# Patient Record
Sex: Female | Born: 1955 | Race: Black or African American | Hispanic: No | Marital: Single | Smoking: Former smoker
Health system: Southern US, Community
[De-identification: ages and names within clinical notes are randomized; demographics above are authoritative.]

## PROBLEM LIST (undated history)

## (undated) DIAGNOSIS — E785 Hyperlipidemia, unspecified: Secondary | ICD-10-CM

## (undated) DIAGNOSIS — E041 Nontoxic single thyroid nodule: Secondary | ICD-10-CM

## (undated) DIAGNOSIS — D649 Anemia, unspecified: Secondary | ICD-10-CM

## (undated) DIAGNOSIS — J309 Allergic rhinitis, unspecified: Secondary | ICD-10-CM

## (undated) DIAGNOSIS — N840 Polyp of corpus uteri: Secondary | ICD-10-CM

## (undated) HISTORY — DX: Anemia, unspecified: D64.9

## (undated) HISTORY — PX: FOOT SURGERY: SHX648

## (undated) HISTORY — DX: Nontoxic single thyroid nodule: E04.1

## (undated) HISTORY — DX: Hyperlipidemia, unspecified: E78.5

## (undated) HISTORY — PX: DILATION AND CURETTAGE OF UTERUS: SHX78

## (undated) HISTORY — DX: Polyp of corpus uteri: N84.0

## (undated) HISTORY — DX: Allergic rhinitis, unspecified: J30.9

---

## 2001-06-23 HISTORY — PX: HYSTEROSCOPY WITH D & C: SHX1775

## 2004-08-01 ENCOUNTER — Ambulatory Visit: Payer: Self-pay | Admitting: Obstetrics and Gynecology

## 2005-03-05 ENCOUNTER — Ambulatory Visit: Payer: Self-pay | Admitting: Internal Medicine

## 2005-09-16 ENCOUNTER — Ambulatory Visit: Payer: Self-pay | Admitting: Obstetrics and Gynecology

## 2006-05-11 ENCOUNTER — Emergency Department: Payer: Self-pay | Admitting: Emergency Medicine

## 2006-05-11 ENCOUNTER — Other Ambulatory Visit: Payer: Self-pay

## 2006-10-20 ENCOUNTER — Ambulatory Visit: Payer: Self-pay | Admitting: Obstetrics and Gynecology

## 2007-06-24 HISTORY — PX: PAROTID GLAND TUMOR EXCISION: SHX5221

## 2007-07-15 ENCOUNTER — Ambulatory Visit: Payer: Self-pay | Admitting: Internal Medicine

## 2007-08-19 ENCOUNTER — Ambulatory Visit: Payer: Self-pay | Admitting: Otolaryngology

## 2007-10-21 ENCOUNTER — Ambulatory Visit: Payer: Self-pay | Admitting: Internal Medicine

## 2008-03-10 ENCOUNTER — Ambulatory Visit: Payer: Self-pay | Admitting: Gastroenterology

## 2008-12-06 ENCOUNTER — Ambulatory Visit: Payer: Self-pay | Admitting: Internal Medicine

## 2009-12-11 ENCOUNTER — Ambulatory Visit: Payer: Self-pay | Admitting: Internal Medicine

## 2011-01-07 ENCOUNTER — Ambulatory Visit: Payer: Self-pay | Admitting: Internal Medicine

## 2011-11-30 ENCOUNTER — Emergency Department: Payer: Self-pay | Admitting: Emergency Medicine

## 2011-11-30 LAB — COMPREHENSIVE METABOLIC PANEL
Albumin: 3.4 g/dL (ref 3.4–5.0)
Alkaline Phosphatase: 98 U/L (ref 50–136)
Anion Gap: 9 (ref 7–16)
Bilirubin,Total: 0.7 mg/dL (ref 0.2–1.0)
Calcium, Total: 9.4 mg/dL (ref 8.5–10.1)
EGFR (African American): >60
EGFR (Non-African Amer.): >60
Osmolality: 278 (ref 275–301)
Potassium: 3.8 mmol/L (ref 3.5–5.1)
Sodium: 140 mmol/L (ref 136–145)

## 2011-11-30 LAB — TROPONIN I: Troponin-I: <0.02 ng/mL

## 2011-11-30 LAB — CBC
HCT: 36.5 % (ref 35.0–47.0)
HGB: 11.8 g/dL — ABNORMAL LOW (ref 12.0–16.0)
MCH: 28.7 pg (ref 26.0–34.0)
MCV: 89 fL (ref 80–100)
Platelet: 297 10*3/uL (ref 150–440)
RBC: 4.11 10*6/uL (ref 3.80–5.20)
WBC: 9.3 10*3/uL (ref 3.6–11.0)

## 2012-03-04 ENCOUNTER — Ambulatory Visit: Payer: Self-pay | Admitting: Obstetrics and Gynecology

## 2013-03-14 LAB — HM PAP SMEAR: HM PAP: NEGATIVE

## 2013-04-19 LAB — CBC
HGB: 11.1 g/dL — ABNORMAL LOW (ref 12.0–16.0)
MCH: 28.9 pg (ref 26.0–34.0)
MCV: 87 fL (ref 80–100)
RBC: 3.83 10*6/uL (ref 3.80–5.20)
RDW: 13.1 % (ref 11.5–14.5)
WBC: 11.5 10*3/uL — ABNORMAL HIGH (ref 3.6–11.0)

## 2013-04-19 LAB — TROPONIN I: Troponin-I: <0.02 ng/mL

## 2013-04-19 LAB — BASIC METABOLIC PANEL
Anion Gap: 5 — ABNORMAL LOW (ref 7–16)
Chloride: 103 mmol/L (ref 98–107)
Co2: 29 mmol/L (ref 21–32)
Creatinine: 1 mg/dL (ref 0.60–1.30)
EGFR (Non-African Amer.): >60
Glucose: 156 mg/dL — ABNORMAL HIGH (ref 65–99)
Osmolality: 276 (ref 275–301)

## 2013-04-20 ENCOUNTER — Inpatient Hospital Stay: Payer: Self-pay | Admitting: Internal Medicine

## 2013-04-20 LAB — TROPONIN I
Troponin-I: 0.12 ng/mL — ABNORMAL HIGH
Troponin-I: 1.8 ng/mL — ABNORMAL HIGH
Troponin-I: 4.6 ng/mL — ABNORMAL HIGH

## 2013-04-20 LAB — CK TOTAL AND CKMB (NOT AT ARMC)
CK, Total: 289 U/L — ABNORMAL HIGH (ref 21–215)
CK-MB: 4 ng/mL — ABNORMAL HIGH (ref 0.5–3.6)
CK-MB: 31.7 ng/mL — ABNORMAL HIGH (ref 0.5–3.6)

## 2013-04-21 LAB — LIPID PANEL
Cholesterol: 200 mg/dL (ref 0–200)
VLDL Cholesterol, Calc: 28 mg/dL (ref 5–40)

## 2013-04-21 LAB — BASIC METABOLIC PANEL
BUN: 8 mg/dL (ref 7–18)
Calcium, Total: 8.8 mg/dL (ref 8.5–10.1)
Chloride: 108 mmol/L — ABNORMAL HIGH (ref 98–107)
Creatinine: 0.76 mg/dL (ref 0.60–1.30)
EGFR (African American): >60
Osmolality: 272 (ref 275–301)
Potassium: 3.9 mmol/L (ref 3.5–5.1)

## 2013-05-16 ENCOUNTER — Ambulatory Visit: Payer: Self-pay | Admitting: Obstetrics and Gynecology

## 2014-05-09 ENCOUNTER — Ambulatory Visit: Payer: Self-pay | Admitting: Obstetrics and Gynecology

## 2014-05-23 ENCOUNTER — Ambulatory Visit: Payer: Self-pay | Admitting: Obstetrics and Gynecology

## 2014-06-06 ENCOUNTER — Ambulatory Visit: Payer: Self-pay | Admitting: Obstetrics and Gynecology

## 2014-06-06 LAB — HM MAMMOGRAPHY

## 2014-06-23 ENCOUNTER — Ambulatory Visit: Payer: Self-pay | Admitting: Obstetrics and Gynecology

## 2014-08-11 DIAGNOSIS — I10 Essential (primary) hypertension: Secondary | ICD-10-CM | POA: Insufficient documentation

## 2014-10-13 NOTE — Discharge Summary (Signed)
PATIENT NAME:  Catherine Pace, Catherine Pace MR#:  [id number] DATE OF BIRTH:  [birth year]  DATE OF ADMISSION:  04/20/2013 DATE OF DISCHARGE:  04/21/2013  PRESENTING COMPLAINT: Chest pain.   DISCHARGE DIAGNOSES: 1.  Acute non-Q-wave myocardial infarction, appears stress induced.  2.  Hyperlipidemia.  3.  Morbid obesity. 4.  Hypertension.   LABORATORY AND PROCEDURES: Cardiac cath done by Dr. Saralyn Pilar shows normal coronaries, EF as 74%.  Lipid profile within normal limits, except LDL of 132. Basic metabolic panel within normal limits. Cardiac enzymes went up from 0.02, 0.12, 1.80, and 4.60.   CONSULTANTS: Cardiology with Dr. Saralyn Pilar.  DISCHARGE MEDICATIONS: 1.  Hydrochlorothiazide/triamterene 25/37.5 mg 1 p.o. daily.  2.  Aspirin 81 mg daily.  3.  Metoprolol 25 mg extended release p.o. daily.  4.  Simvastatin 20 mg at bedtime.   DIET: Low fat, low cholesterol.  DISCHARGE FOLLOWUP:  1.  Establish primary care physician in the area.  2.  Follow up with Dr. Saralyn Pilar in 2 to 4 weeks as needed.   BRIEF SUMMARY OF HOSPITAL COURSE: Ms. Cost is a pleasant 59 year old African American female who came to the Emergency Room with chest pain. She received 324 mg of aspirin in the ED. She was found to have positive enzymes and was admitted with:  1.  Acute non-Q-wave MI. The patient was continued on sub-Q Lovenox, beta blockers, aspirin and statins. She was seen by Dr. Saralyn Pilar and with her positive enzymes underwent cardiac catheterization which was essentially negative with normal coronaries. It was suspected stress-induced with the patient undergoing a lot of stress at home. She did not have any further chest pain, she did very well after cardiac cath and will follow up with Dr. Saralyn Pilar as outpatient.  2.  Hypertension. Blood pressure meds, hydrochlorothiazide/triamterene, were resumed. She was also placed on low-dose beta blockers.  3.  Hyperlipidemia. On statins.  4.  Hypokalemia, repleted.  5.  GI  prophylaxis. The patient was kept on Protonix.   Hospital stay otherwise remained stable. The patient remained a FULL CODE.   TIME SPENT: 40 minutes.  ____________________________ Hart Rochester Posey Pronto, MD sap:sb D: 04/22/2013 07:30:38 ET T: 04/22/2013 07:47:43 ET JOB#: [id number]  cc: Brayley Mackowiak A. Posey Pronto, MD, <Dictator> Ilda Basset MD ELECTRONICALLY SIGNED 04/28/2013 13:27

## 2014-10-13 NOTE — H&P (Signed)
PATIENT NAME:  Catherine Pace, SCHLEICH MR#:  [id number] DATE OF BIRTH:  1955-07-15  DATE OF ADMISSION:  04/19/2013  REFERRING PHYSICIAN: Dr. Marjean Donna,   PRIMARY CARE PHYSICIAN: Dr. Enzo Bi.   PRIMARY CARDIOLOGIST: Dr. Ubaldo Glassing.   CHIEF COMPLAINT: Chest pain.   HISTORY OF PRESENT ILLNESS: This is a 59 year old female with significant past medical history of borderline hypertension, hyperlipidemia controlled with diet. The patient presents with complaints of chest pain. The patient reports she has been seen by Dr. Ubaldo Glassing recently for complaints of palpitation and shortness of breath, where she had a Holter monitor and 2-D echo done recently without any significant abnormalities. The patient denies any chest pain in the past. Reports this is the first episode she had at night, midsternal, radiating to the left arm, aching/pressure quality, initially 5 to 6 out of 10, currently 2 out of 10 without receiving any nitro or pain meds. The patient as well is complaining of shortness of breath, nausea and diaphoresis. Denies any palpitations. The patient's EKG did not show any significant ST or T wave abnormalities. Her first troponin was negative. The patient had her troponin cycled in the ED with repeat troponin came back positive at 0.12. The patient already received 324 of aspirin in the ED. Hospitalist service was requested to admit the patient for further treatment of her non-ST elevated MI.    PAST MEDICAL HISTORY:  1. Hyperlipidemia.  2. Borderline hypertension.   FAMILY HISTORY: Denies any coronary artery disease at a young age.   SOCIAL HISTORY: The patient quit smoking in 1994. No alcohol. No illicit drug use. Lives at home with her family.   ALLERGIES: No known drug allergies.   HOME MEDICATION: Hydrochlorothiazide/triamterene 25/37.5 once a day.   REVIEW OF SYSTEMS:  CONSTITUTIONAL: Denies fever, chills, fatigue, weakness, weight gain, weight loss.  EYES: Denies blurry vision, double vision,  inflammation, glaucoma.  ENT: Denies tinnitus, ear pain, hearing loss, epistaxis.  RESPIRATORY: Denies any cough, wheezing, hemoptysis, painful respiratory, COPD. Complains of mild shortness of breath.  CARDIOVASCULAR: Complains of chest pain radiating to the left arm. Denies edema, arrhythmia, palpitation, syncope.  GASTROINTESTINAL: Complains of nausea. Denies any vomiting, diarrhea, abdominal pain, hematemesis, jaundice.  GENITOURINARY: Denies dysuria, hematuria, renal colic.  ENDOCRINE: Denies polyuria, polydipsia, heat or cold intolerance.  HEMATOLOGY: Denies anemia, easy bruising, bleeding diathesis.  INTEGUMENTARY: Denies acne, rash or skin lesions.  MUSCULOSKELETAL: Denies any gout, arthritis or cramps.  NEUROLOGIC: Denies CVA, TIA, headache, vertigo, tremor.  PSYCHIATRIC: Denies anxiety, insomnia, depression or schizophrenia.   PHYSICAL EXAMINATION:  VITAL SIGNS: Temperature 98, pulse 78, respiratory rate 20, blood pressure 140/54, saturating 100% on room air.  GENERAL: Obese female, comfortable in bed, in no apparent distress.  HEENT: Head atraumatic, normocephalic. Pupils equal, reactive to light. Pink conjunctivae. Anicteric sclerae. Moist oral mucosa.  NECK: Supple. No thyromegaly. No JVD.  CHEST: Good air entry bilaterally. No wheezing, rales, rhonchi.  CARDIOVASCULAR: S1, S2 heard. No rubs, murmurs or gallops.  ABDOMEN: Soft, nontender, nondistended. Bowel sounds present.  EXTREMITIES: No edema. No clubbing. No cyanosis.  PSYCHIATRIC: Appropriate affect. Awake, alert x 3. Intact judgment and insight.  NEUROLOGIC: Cranial nerves grossly intact. Motor 5 out of 5. No focal deficits.  LYMPHATIC: No cervical or supraclavicular lymphadenopathy.   PERTINENT LABORATORIES: Glucose 156, BUN 11, creatinine 1, sodium 137, potassium 3.3, chloride 103, CO2 29. Anion gap 5. Troponin 0.12, CK-MB 4. White blood cells 11.5, hemoglobin 11.1, hematocrit 33.5, platelets 254. D-dimer 0.38.   EKG  showing normal sinus rhythm at 77 beats per minute.   ASSESSMENT AND PLAN:  1. Non-ST elevated myocardial infarction: The patient presents with chest pain, with positive troponins. The patient will be admitted to telemetry unit. Will continue to cycle her cardiac enzymes and follow the trend. Already received 324 of aspirin in the Emergency Department. Will start her on subcutaneous Lovenox 1 mg/kg every 12 hours for anticoagulation. Will have her on beta blockers. Will start her on nitro paste and p.r.n. sublingual nitroglycerin. Will start her on statin. Will check her lipid panel. Will keep her n.p.o. after breakfast if there are any procedures that need to be done by cardiology. Will consult [facility] Cardiology as the patient has been seen and followed by Dr. Ubaldo Glassing recently.  2. Hypertension: Blood pressure is acceptable. Will hold her home medications, including hydrochlorothiazide/triamterene, given the fact that the patient will be started on beta blockers and nitro paste and do not want her blood pressure to drop.  3. Hyperlipidemia: Will check lipid panel but will start the patient on statin.  4. Hypokalemia: Will replace. Will give 40 p.o. now. Will repeat in 4 hours.  5. Deep vein thrombosis prophylaxis: The patient is on full-dose anticoagulation for her non-ST elevation myocardial infarction.  6. Gastrointestinal prophylaxis: The patient is on Protonix.   CODE STATUS: FULL CODE.   TOTAL TIME SPENT ON ADMISSION AND PATIENT CARE: 55 minutes.   ____________________________ Albertine Patricia, MD dse:gb D: 04/20/2013 02:51:23 ET T: 04/20/2013 03:15:15 ET JOB#: [id number]  cc: Albertine Patricia, MD, <Dictator> Triana Coover Graciela Husbands MD ELECTRONICALLY SIGNED 04/20/2013 7:09

## 2014-10-13 NOTE — Consult Note (Signed)
PATIENT NAME:  Catherine Pace, HEACOX MR#:  [id number] DATE OF BIRTH:  [birth year]  CARDIOLOGY CONSULTATION  DATE OF CONSULTATION:  04/20/2013  PRIMARY CARE PHYSICIAN:  Malachi Paradise, MD CONSULTING PHYSICIAN:  Isaias Cowman, MD  CHIEF COMPLAINT: Chest pain.   REASON FOR CONSULTATION: Consultation requested for evaluation of chest pain and elevated troponin.   HISTORY OF PRESENT ILLNESS: The patient is a 59 year old female with history of hyperlipidemia, borderline hypertension, and family history of coronary artery disease, who presents with chest pain. On 04/19/2013, the patient experienced substernal chest discomfort which she described as 5/10 with radiation to her left arm.   The patient presented to [facility], following which  her chest was somewhat improved, rated 2/10. EKG did not reveal any acute ischemic ST-T-wave changes. The patient's first troponin was negative. Follow-up troponin was 0.12. The third troponin was 1.8. Total CPK-MB and MB were 289 and 20.8, respectively. The patient reports some mild residual chest discomfort.   PAST MEDICAL HISTORY:  1.  Hyperlipidemia.  2.  Borderline hypertension.   MEDICATIONS: Hydrochlorothiazide/triamterene 25/37.5 daily.   SOCIAL HISTORY: The patient quit tobacco abuse in 1994.   FAMILY HISTORY: Father and mother both status post coronary artery bypass graft surgery in their 26s.   REVIEW OF SYSTEMS:  CONSTITUTIONAL: No fever or chills.  EYES: No blurry vision.  EARS: No hearing loss.  RESPIRATORY: No shortness of breath.  CARDIOVASCULAR: Chest pain as described above.  GASTROINTESTINAL: No nausea, vomiting, diarrhea, or constipation.  GENITOURINARY: No dysuria or hematuria.  ENDOCRINE: No polyuria or polydipsia.  MUSCULOSKELETAL: No arthralgias or myalgias.  NEUROLOGICAL: No focal muscle weakness or numbness.  PSYCHOLOGICAL: No depression or anxiety.   PHYSICAL EXAMINATION:  VITAL SIGNS: Blood pressure 121/79,  pulse 58, respirations 20, temperature 97.9, pulse oximetry 95%.  HEENT: Pupils equal, reactive to light and accommodation.  NECK: Supple without thyromegaly.  LUNGS: Clear.  CARDIOVASCULAR: Normal JVP. Normal PMI. Regular rate and rhythm. Normal S1, S2. No appreciable gallop, murmur, or rub.  ABDOMEN: Soft and nontender. Pulses were intact bilaterally.  MUSCULOSKELETAL: Normal muscle tone.  NEUROLOGIC: Alert and oriented x3. Motor and sensory both grossly intact.   IMPRESSION: This is a 59 year old female with multiple cardiovascular risk factors who presents with new-onset chest pain, has ruled in for non-ST-elevation myocardial infarction.   RECOMMENDATIONS:  1.  Agree with overall current therapy.  2.  Proceed with a cardiac catheterization with selective coronary arteriography scheduled for 04/21/2013. The risks, benefits, and alternatives were explained and informed written consent obtained.   ____________________________ Isaias Cowman, MD ap:np D: 04/20/2013 16:08:29 ET T: 04/20/2013 16:29:41 ET JOB#: [id number]  cc: Isaias Cowman, MD, <Dictator> Isaias Cowman MD ELECTRONICALLY SIGNED 05/11/2013 9:32

## 2015-04-24 ENCOUNTER — Encounter: Payer: Self-pay | Admitting: Obstetrics and Gynecology

## 2015-04-24 ENCOUNTER — Ambulatory Visit (INDEPENDENT_AMBULATORY_CARE_PROVIDER_SITE_OTHER): Payer: Managed Care, Other (non HMO) | Admitting: Obstetrics and Gynecology

## 2015-04-24 VITALS — BP 129/78 | HR 69 | Ht 70.0 in | Wt 254.1 lb

## 2015-04-24 DIAGNOSIS — M25561 Pain in right knee: Secondary | ICD-10-CM | POA: Diagnosis not present

## 2015-04-24 DIAGNOSIS — R638 Other symptoms and signs concerning food and fluid intake: Secondary | ICD-10-CM | POA: Diagnosis not present

## 2015-04-24 DIAGNOSIS — Z01419 Encounter for gynecological examination (general) (routine) without abnormal findings: Secondary | ICD-10-CM | POA: Diagnosis not present

## 2015-04-24 DIAGNOSIS — Z6839 Body mass index (BMI) 39.0-39.9, adult: Secondary | ICD-10-CM | POA: Insufficient documentation

## 2015-04-24 DIAGNOSIS — Z1231 Encounter for screening mammogram for malignant neoplasm of breast: Secondary | ICD-10-CM | POA: Diagnosis not present

## 2015-04-24 DIAGNOSIS — E785 Hyperlipidemia, unspecified: Secondary | ICD-10-CM

## 2015-04-24 DIAGNOSIS — Z1211 Encounter for screening for malignant neoplasm of colon: Secondary | ICD-10-CM

## 2015-04-24 DIAGNOSIS — N840 Polyp of corpus uteri: Secondary | ICD-10-CM | POA: Insufficient documentation

## 2015-04-24 DIAGNOSIS — M25569 Pain in unspecified knee: Secondary | ICD-10-CM | POA: Insufficient documentation

## 2015-04-24 MED ORDER — HYDROCHLOROTHIAZIDE 12.5 MG PO CAPS: 12.5000 mg | ORAL_CAPSULE | Freq: Every day | ORAL | Status: DC

## 2015-04-24 NOTE — Progress Notes (Signed)
Patient ID: Catherine Pace, female   DOB: [birth year], 59 y.o.   MRN: [id number] ANNUAL PREVENTATIVE CARE GYN  ENCOUNTER NOTE  Subjective:       Catherine Pace is a 59 y.o. G0P0000 female here for a routine annual gynecologic exam.  Current complaints: 1.  hctz refill  2.  Right knee pain   Gynecologic History Patient's last menstrual period was 04/08/2015 (exact date). Contraception: none Last Pap: 02/2013 neg/neg. Results were: normal Last mammogram: 2015. Results were: normal  Obstetric History OB History  Gravida Para Term Preterm AB SAB TAB Ectopic Multiple Living  0 0 0 0 0 0 0 0 0 0         Past Medical History  Diagnosis Date  . HLD (hyperlipidemia)   . Obesity   . Endometrial polyp   . Anemia   . AR (allergic rhinitis)     Past Surgical History  Procedure Laterality Date  . Foot surgery Right   . Hysteroscopy w/d&c  2003    endometrial polyp disordered proliferative endometrium  . Parotid gland tumor excision  2009    Current Outpatient Prescriptions on File Prior to Visit  Medication Sig Dispense Refill  . aspirin 81 MG tablet Take 81 mg by mouth daily.    . calcium-vitamin D (OSCAL) 250-125 MG-UNIT tablet Take 1 tablet by mouth daily.    . hydrochlorothiazide (MICROZIDE) 12.5 MG capsule Take 12.5 mg by mouth daily.    . vitamin E 100 UNIT capsule Take by mouth daily.     No current facility-administered medications on file prior to visit.    No Known Allergies  Social History   Social History  . Marital Status: Single    Spouse Name: N/A  . Number of Children: N/A  . Years of Education: N/A   Occupational History  . Not on file.   Social History Main Topics  . Smoking status: Never Smoker   . Smokeless tobacco: Not on file  . Alcohol Use: No  . Drug Use: No  . Sexual Activity: Not Currently    Birth Control/ Protection: None   Other Topics Concern  . Not on file   Social History Narrative    Family History  Problem Relation Age of Onset   . Diabetes Mother   . Hypertension Mother   . Hypertension Father   . Prostate cancer Father   . Breast cancer Maternal Aunt   . Diabetes Maternal Aunt   . Colon cancer Neg Hx   . Ovarian cancer Neg Hx     The following portions of the patient's history were reviewed and updated as appropriate: allergies, current medications, past family history, past medical history, past social history, past surgical history and problem list.  Review of Systems ROS Review of Systems - General ROS: negative for - chills, fatigue, fever, hot flashes, night sweats, weight gain or weight loss Psychological ROS: negative for - anxiety, decreased libido, depression, mood swings, physical abuse or sexual abuse Ophthalmic ROS: negative for - blurry vision, eye pain or loss of vision ENT ROS: negative for - headaches, hearing change, visual changes or vocal changes Allergy and Immunology ROS: negative for - hives, itchy/watery eyes or seasonal allergies Hematological and Lymphatic ROS: negative for - bleeding problems, bruising, swollen lymph nodes or weight loss Endocrine ROS: negative for - galactorrhea, hair pattern changes, hot flashes, malaise/lethargy, mood swings, palpitations, polydipsia/polyuria, skin changes, temperature intolerance or unexpected weight changes Breast ROS: negative for - new or changing  breast lumps or nipple discharge Respiratory ROS: negative for - cough or shortness of breath Cardiovascular ROS: negative for - chest pain, irregular heartbeat, palpitations or shortness of breath Gastrointestinal ROS: no abdominal pain, change in bowel habits, or black or bloody stools Genito-Urinary ROS: no dysuria, trouble voiding, or hematuria Musculoskeletal ROS: negative for - joint pain or joint stiffness Neurological ROS: negative for - bowel and bladder control changes Dermatological ROS: negative for rash and skin lesion changes   Objective:   BP 129/78 mmHg  Pulse 69  Ht 5\' 10"   (1.778 m)  Wt 254 lb 1.6 oz (115.259 kg)  BMI 36.46 kg/m2  LMP 04/08/2015 (Exact Date) CONSTITUTIONAL: Well-developed, well-nourished female in no acute distress.  PSYCHIATRIC: Normal mood and affect. Normal behavior. Normal judgment and thought content. [place]: Alert and oriented to person, place, and time. Normal muscle tone coordination. No cranial nerve deficit noted. HENT:  Normocephalic, atraumatic, External right and left ear normal. Oropharynx is clear and moist EYES: Conjunctivae and EOM are normal. Pupils are equal, round, and reactive to light. No scleral icterus.  NECK: Normal range of motion, supple, no masses.  Normal thyroid.  SKIN: Skin is warm and dry. No rash noted. Not diaphoretic. No erythema. No pallor. CARDIOVASCULAR: Normal heart rate noted, regular rhythm, no murmur. RESPIRATORY: Clear to auscultation bilaterally. Effort and breath sounds normal, no problems with respiration noted. BREASTS: Symmetric in size. No masses, skin changes, nipple drainage, or lymphadenopathy. ABDOMEN: Soft, normal bowel sounds, no distention noted.  No tenderness, rebound or guarding.  BLADDER: Normal PELVIC:  External Genitalia: Normal  BUS: Normal  Vagina: Normal  Cervix: Normal  Uterus: Normal  Adnexa: Normal  RV: External Exam NormaI, No Rectal Masses and Normal Sphincter tone  MUSCULOSKELETAL: Normal range of motion. No tenderness.  No cyanosis, clubbing, or edema.  2+ distal pulses. LYMPHATIC: No Axillary, Supraclavicular, or Inguinal Adenopathy.    Assessment:   Annual gynecologic examination 59 y.o. Contraception: none Obesity 1 Problem List Items Addressed This Visit    None      Plan:  Pap: Not needed Mammogram: Ordered Stool Guaiac Testing:  Ordered Labs: lipid vit d tsh fbs a1c Routine preventative health maintenance measures emphasized: Exercise/Diet/Weight control, Tobacco Warnings and Alcohol/Substance use risks Knee brace recommended to assist with  exercise and right knee support Return to [place], CMA  Brayton Mars, MD  Note: This dictation was prepared with Dragon dictation along with smaller phrase technology. Any transcriptional errors that result from this process are unintentional.

## 2015-04-24 NOTE — Patient Instructions (Signed)
1.  No Pap smear done. 2.  Mammogram recommended. 3.  Stool guaiac cards given. 4.  Patient is encouraged to continue with diet and exercise for weight loss.  Patient has lost 10 pounds this year. 5.  Screening blood work today 6.  Return in one year for annual exam

## 2015-04-26 LAB — HEMOGLOBIN A1C
Est. average glucose Bld gHb Est-mCnc: 123 mg/dL
HEMOGLOBIN A1C: 5.9 % — AB (ref 4.8–5.6)

## 2015-04-26 LAB — VITAMIN D 25 HYDROXY (VIT D DEFICIENCY, FRACTURES): VIT D 25 HYDROXY: 15.8 ng/mL — AB (ref 30.0–100.0)

## 2015-04-26 LAB — LIPID PANEL
CHOL/HDL RATIO: 3.9 ratio (ref 0.0–4.4)
Cholesterol, Total: 230 mg/dL — ABNORMAL HIGH (ref 100–199)
HDL: 59 mg/dL (ref >39)
LDL Calculated: 149 mg/dL — ABNORMAL HIGH (ref 0–99)
Triglycerides: 112 mg/dL (ref 0–149)
VLDL CHOLESTEROL CAL: 22 mg/dL (ref 5–40)

## 2015-04-26 LAB — TSH: TSH: 1.72 u[IU]/mL (ref 0.450–4.500)

## 2015-04-26 LAB — GLUCOSE, RANDOM: Glucose: 74 mg/dL (ref 65–99)

## 2015-05-04 ENCOUNTER — Telehealth: Payer: Self-pay

## 2015-05-04 DIAGNOSIS — R7309 Other abnormal glucose: Secondary | ICD-10-CM

## 2015-05-04 DIAGNOSIS — E785 Hyperlipidemia, unspecified: Secondary | ICD-10-CM

## 2015-05-04 NOTE — Telephone Encounter (Signed)
-----   Message from Brayton Mars, MD sent at 05/02/2015  2:18 PM EST ----- Please notify - Abnormal Labs Encourage vitamin D 1000 units a day. Encourage healthy eating and exercise and repeat lipid panel and hemoglobin A1c in 6 months

## 2015-05-04 NOTE — Telephone Encounter (Signed)
Pt aware. Labs ordered. Pt aware to be fasting for labs.

## 2015-06-08 ENCOUNTER — Ambulatory Visit
Admission: RE | Admit: 2015-06-08 | Discharge: 2015-06-08 | Disposition: A | Discharge status: Home / Self Care | Payer: Managed Care, Other (non HMO) | Source: Ambulatory Visit | Attending: Obstetrics and Gynecology | Admitting: Obstetrics and Gynecology

## 2015-06-08 DIAGNOSIS — Z1231 Encounter for screening mammogram for malignant neoplasm of breast: Secondary | ICD-10-CM | POA: Diagnosis not present

## 2016-04-21 NOTE — Progress Notes (Signed)
Patient ID: Catherine Pace, female   DOB: [birth year], 60 y.o.   MRN: [id number] ANNUAL PREVENTATIVE CARE GYN  ENCOUNTER NOTE  Subjective:       Catherine Pace is a 60 y.o. G0P0000 female here for a routine annual gynecologic exam.  Current complaints: 1. Bleeding from rectum- 02/2016- Pt notes for 2 weeks in September she would strain to have a bowel movement, had hard stools and noticed red blood with wiping. Notes a little bit of a "tearing" pain with BMs. She states a change in diet lined up with these problems ans since she switched diet again symptoms have subsided.  No history of hemorrhoids. Colonoscopy at age 74.   2. Irregular bleeding- Regular periods up until July. Missed her period in August-Sept, light bleeding in Oct. No pelvic pain associated with menstrual changes. Denies hot flashes, night sweats and vaginal irritation. Notes mild irritability at times.    Gynecologic History LMP- 02/2016 Contraception: none Last Pap: 02/2013 neg/neg. Results were: normal Last mammogram: 2016 birad 1. Results were: normal  Obstetric History OB History  Gravida Para Term Preterm AB Living  0 0 0 0 0 0  SAB TAB Ectopic Multiple Live Births  0 0 0 0          Past Medical History:  Diagnosis Date  . Anemia   . AR (allergic rhinitis)   . Endometrial polyp   . HLD (hyperlipidemia)   . Obesity     Past Surgical History:  Procedure Laterality Date  . FOOT SURGERY Right   . HYSTEROSCOPY W/D&C  2003   endometrial polyp disordered proliferative endometrium  . PAROTID GLAND TUMOR EXCISION  2009    Current Outpatient Prescriptions on File Prior to Visit  Medication Sig Dispense Refill  . aspirin 81 MG tablet Take 81 mg by mouth daily.    . calcium-vitamin D (OSCAL) 250-125 MG-UNIT tablet Take 1 tablet by mouth daily.    . hydrochlorothiazide (MICROZIDE) 12.5 MG capsule Take 1 capsule (12.5 mg total) by mouth daily. 30 capsule 6  . loratadine (CLARITIN) 10 MG tablet TK 1 T PO QD  0  .  vitamin E 100 UNIT capsule Take by mouth daily.     No current facility-administered medications on file prior to visit.     No Known Allergies  Social History   Social History  . Marital status: Single    Spouse name: N/A  . Number of children: N/A  . Years of education: N/A   Occupational History  . Not on file.   Social History Main Topics  . Smoking status: Never Smoker  . Smokeless tobacco: Not on file  . Alcohol use No  . Drug use: No  . Sexual activity: Not Currently    Birth control/ protection: None   Other Topics Concern  . Not on file   Social History Narrative  . No narrative on file    Family History  Problem Relation Age of Onset  . Diabetes Mother   . Hypertension Mother   . Hypertension Father   . Prostate cancer Father   . Breast cancer Maternal Aunt 58  . Diabetes Maternal Aunt   . Colon cancer Neg Hx   . Ovarian cancer Neg Hx     The following portions of the patient's history were reviewed and updated as appropriate: allergies, current medications, past family history, past medical history, past social history, past surgical history and problem list.  Review of Systems ROS Review  of Systems - General ROS: negative for - chills, fatigue, fever, hot flashes, night sweats, weight gain or weight loss Psychological ROS: negative for - anxiety, decreased libido, depression, mood swings, physical abuse or sexual abuse Ophthalmic ROS: negative for - blurry vision, eye pain or loss of vision ENT ROS: negative for - headaches, hearing change, visual changes or vocal changes Allergy and Immunology ROS: negative for - hives, itchy/watery eyes or seasonal allergies Hematological and Lymphatic ROS: negative for - bleeding problems, bruising, swollen lymph nodes or weight loss Endocrine ROS: negative for - galactorrhea, hair pattern changes, hot flashes, malaise/lethargy, mood swings, palpitations, polydipsia/polyuria, skin changes, temperature intolerance  or unexpected weight changes Breast ROS: negative for - new or changing breast lumps or nipple discharge Respiratory ROS: negative for - cough or shortness of breath Cardiovascular ROS: negative for - chest pain, irregular heartbeat, palpitations or shortness of breath Gastrointestinal ROS: no abdominal pain, change in bowel habits, or black stools Genito-Urinary ROS: no dysuria, trouble voiding, or hematuria Musculoskeletal ROS: negative for - joint pain or joint stiffness Neurological ROS: negative for - bowel and bladder control changes Dermatological ROS: negative for rash and skin lesion changes   Objective:  BP 139/76   Pulse 73   Ht 5\' 10"  (1.778 m)   Wt 254 lb 12.8 oz (115.6 kg)   LMP 04/15/2016 (Exact Date)   BMI 36.56 kg/m   CONSTITUTIONAL: Well-developed, well-nourished female in no acute distress.  PSYCHIATRIC: Normal mood and affect. Normal behavior. Normal judgment and thought content. [place]: Alert and oriented to person, place, and time. Normal muscle tone coordination. No cranial nerve deficit noted. HENT:  Normocephalic, atraumatic, External right and left ear normal. Oropharynx is clear and moist EYES: Conjunctivae and EOM are normal. No scleral icterus.  NECK: Normal range of motion, supple, no masses.  Normal thyroid.  SKIN: Skin is warm and dry. No rash noted. Not diaphoretic. No erythema. No pallor. CARDIOVASCULAR: Normal heart rate noted, regular rhythm, no murmur. RESPIRATORY: Clear to auscultation bilaterally. Effort and breath sounds normal, no problems with respiration noted. BREASTS: Symmetric in size. No masses, skin changes, nipple drainage, or lymphadenopathy. ABDOMEN: Soft, normal bowel sounds, no distention noted.  No tenderness, rebound or guarding.  BLADDER: Normal PELVIC:  External Genitalia: Normal  BUS: Normal  Vagina: Normal  Cervix: Normal; No cervical motion tenderness; no lesions  Uterus: Normal; Midplane, top normal size, nontender,  mobile  Adnexa: Normal; nonpalpable and nontender  RV: External Exam NormaI, No Rectal Masses and Normal Sphincter tone; skin tag noted on  external exam LYMPHATIC: No Axillary, Supraclavicular, or Inguinal Adenopathy.    Assessment:   Annual gynecologic examination 60 y.o. Contraception: none Obesity 1 - Discussed diet and exercise. In a program at work working on losing weight.   Bleeding per rectum- likely anal fissure or irritation of rectal area due to skin tag on physical exam, straining, hard stools, red blood on toilet paper and tearing pain. No hemorrhoids appreciated on exam. Will use stool guaiac cards and refer to Surgery if positive.  Perimenopause- changes in menstrual cycle over the past several months and irritability likely due to climacteric state. Will monitor with menstrual calendar.   Plan:  Pap: pap/hpv Mammogram: Ordered Stool Guaiac Testing:  Ordered; if positive screening will refer to GI for colonoscopy due to recent rectal bleeding Menstrual calendar monitoring  Labs: lipid vit d tsh fbs a1c Routine preventative health maintenance measures emphasized: Exercise/Diet/Weight control, Tobacco Warnings and Alcohol/Substance use risks  Return  to Clinic - 1 [address] [place], [place]  Alla German, PA-S Brayton Mars, MD   I have seen, interviewed, and examined the patient in conjunction with the [facility].A. student and affirm the diagnosis and management plan. Martin A. DeFrancesco, MD, FACOG   Note: This dictation was prepared with Dragon dictation along with smaller phrase technology. Any transcriptional errors that result from this process are unintentional.

## 2016-04-24 ENCOUNTER — Encounter: Payer: Self-pay | Admitting: Obstetrics and Gynecology

## 2016-04-24 ENCOUNTER — Ambulatory Visit (INDEPENDENT_AMBULATORY_CARE_PROVIDER_SITE_OTHER): Payer: Managed Care, Other (non HMO) | Admitting: Obstetrics and Gynecology

## 2016-04-24 VITALS — BP 139/76 | HR 73 | Ht 70.0 in | Wt 254.8 lb

## 2016-04-24 DIAGNOSIS — K625 Hemorrhage of anus and rectum: Secondary | ICD-10-CM | POA: Diagnosis not present

## 2016-04-24 DIAGNOSIS — E785 Hyperlipidemia, unspecified: Secondary | ICD-10-CM | POA: Diagnosis not present

## 2016-04-24 DIAGNOSIS — Z6836 Body mass index (BMI) 36.0-36.9, adult: Secondary | ICD-10-CM

## 2016-04-24 DIAGNOSIS — Z01419 Encounter for gynecological examination (general) (routine) without abnormal findings: Secondary | ICD-10-CM

## 2016-04-24 DIAGNOSIS — E6609 Other obesity due to excess calories: Secondary | ICD-10-CM

## 2016-04-24 DIAGNOSIS — Z0000 Encounter for general adult medical examination without abnormal findings: Secondary | ICD-10-CM

## 2016-04-24 DIAGNOSIS — R7309 Other abnormal glucose: Secondary | ICD-10-CM | POA: Diagnosis not present

## 2016-04-24 DIAGNOSIS — Z1211 Encounter for screening for malignant neoplasm of colon: Secondary | ICD-10-CM | POA: Diagnosis not present

## 2016-04-24 DIAGNOSIS — IMO0001 Reserved for inherently not codable concepts without codable children: Secondary | ICD-10-CM

## 2016-04-24 DIAGNOSIS — Z1231 Encounter for screening mammogram for malignant neoplasm of breast: Secondary | ICD-10-CM | POA: Diagnosis not present

## 2016-04-24 NOTE — Patient Instructions (Addendum)
Health Maintenance, Female Adopting a healthy lifestyle and getting preventive care can go a long way to promote health and wellness. Talk with your health care provider about what schedule of regular examinations is right for you. This is a good chance for you to check in with your provider about disease prevention and staying healthy. In between checkups, there are plenty of things you can do on your own. Experts have done a lot of research about which lifestyle changes and preventive measures are most likely to keep you healthy. Ask your health care provider for more information. WEIGHT AND DIET  Eat a healthy diet  Be sure to include plenty of vegetables, fruits, low-fat dairy products, and lean protein.  Do not eat a lot of foods high in solid fats, added sugars, or salt.  Get regular exercise. This is one of the most important things you can do for your health.  Most adults should exercise for at least 150 minutes each week. The exercise should increase your heart rate and make you sweat (moderate-intensity exercise).  Most adults should also do strengthening exercises at least twice a week. This is in addition to the moderate-intensity exercise.  Maintain a healthy weight  Body mass index (BMI) is a measurement that can be used to identify possible weight problems. It estimates body fat based on height and weight. Your health care provider can help determine your BMI and help you achieve or maintain a healthy weight.  For females 20 years of age and older:   A BMI below 18.5 is considered underweight.  A BMI of 18.5 to 24.9 is normal.  A BMI of 25 to 29.9 is considered overweight.  A BMI of 30 and above is considered obese.  Watch levels of cholesterol and blood lipids  You should start having your blood tested for lipids and cholesterol at 58 years of age, then have this test every 5 years.  You may need to have your cholesterol levels checked more often if:  Your lipid  or cholesterol levels are high.  You are older than 58 years of age.  You are at high risk for heart disease.  CANCER SCREENING   Lung Cancer  Lung cancer screening is recommended for adults 55-80 years old who are at high risk for lung cancer because of a history of smoking.  A yearly low-dose CT scan of the lungs is recommended for people who:  Currently smoke.  Have quit within the past 15 years.  Have at least a 30-pack-year history of smoking. A pack year is smoking an average of one pack of cigarettes a day for 1 year.  Yearly screening should continue until it has been 15 years since you quit.  Yearly screening should stop if you develop a health problem that would prevent you from having lung cancer treatment.  Breast Cancer  Practice breast self-awareness. This means understanding how your breasts normally appear and feel.  It also means doing regular breast self-exams. Let your health care provider know about any changes, no matter how small.  If you are in your 20s or 30s, you should have a clinical breast exam (CBE) by a health care provider every 1-3 years as part of a regular health exam.  If you are 40 or older, have a CBE every year. Also consider having a breast X-ray (mammogram) every year.  If you have a family history of breast cancer, talk to your health care provider about genetic screening.  If you   are at high risk for breast cancer, talk to your health care provider about having an MRI and a mammogram every year.  Breast cancer gene (BRCA) assessment is recommended for women who have family members with BRCA-related cancers. BRCA-related cancers include:  Breast.  Ovarian.  Tubal.  Peritoneal cancers.  Results of the assessment will determine the need for genetic counseling and BRCA1 and BRCA2 testing. Cervical Cancer Your health care provider may recommend that you be screened regularly for cancer of the pelvic organs (ovaries, uterus, and  vagina). This screening involves a pelvic examination, including checking for microscopic changes to the surface of your cervix (Pap test). You may be encouraged to have this screening done every 3 years, beginning at age 21.  For women ages 30-65, health care providers may recommend pelvic exams and Pap testing every 3 years, or they may recommend the Pap and pelvic exam, combined with testing for human papilloma virus (HPV), every 5 years. Some types of HPV increase your risk of cervical cancer. Testing for HPV may also be done on women of any age with unclear Pap test results.  Other health care providers may not recommend any screening for nonpregnant women who are considered low risk for pelvic cancer and who do not have symptoms. Ask your health care provider if a screening pelvic exam is right for you.  If you have had past treatment for cervical cancer or a condition that could lead to cancer, you need Pap tests and screening for cancer for at least 20 years after your treatment. If Pap tests have been discontinued, your risk factors (such as having a new sexual partner) need to be reassessed to determine if screening should resume. Some women have medical problems that increase the chance of getting cervical cancer. In these cases, your health care provider may recommend more frequent screening and Pap tests. Colorectal Cancer  This type of cancer can be detected and often prevented.  Routine colorectal cancer screening usually begins at 58 years of age and continues through 58 years of age.  Your health care provider may recommend screening at an earlier age if you have risk factors for colon cancer.  Your health care provider may also recommend using home test kits to check for hidden blood in the stool.  A small camera at the end of a tube can be used to examine your colon directly (sigmoidoscopy or colonoscopy). This is done to check for the earliest forms of colorectal  cancer.  Routine screening usually begins at age 50.  Direct examination of the colon should be repeated every 5-10 years through 58 years of age. However, you may need to be screened more often if early forms of precancerous polyps or small growths are found. Skin Cancer  Check your skin from head to toe regularly.  Tell your health care provider about any new moles or changes in moles, especially if there is a change in a mole's shape or color.  Also tell your health care provider if you have a mole that is larger than the size of a pencil eraser.  Always use sunscreen. Apply sunscreen liberally and repeatedly throughout the day.  Protect yourself by wearing long sleeves, pants, a wide-brimmed hat, and sunglasses whenever you are outside. HEART DISEASE, DIABETES, AND HIGH BLOOD PRESSURE   High blood pressure causes heart disease and increases the risk of stroke. High blood pressure is more likely to develop in:  People who have blood pressure in the high end   of the normal range (130-139/85-89 mm Hg).  People who are overweight or obese.  People who are African American.  If you are 38-23 years of age, have your blood pressure checked every 3-5 years. If you are 61 years of age or older, have your blood pressure checked every year. You should have your blood pressure measured twice--once when you are at a hospital or clinic, and once when you are not at a hospital or clinic. Record the average of the two measurements. To check your blood pressure when you are not at a hospital or clinic, you can use:  An automated blood pressure machine at a pharmacy.  A home blood pressure monitor.  If you are between 45 years and 39 years old, ask your health care provider if you should take aspirin to prevent strokes.  Have regular diabetes screenings. This involves taking a blood sample to check your fasting blood sugar level.  If you are at a normal weight and have a low risk for diabetes,  have this test once every three years after 58 years of age.  If you are overweight and have a high risk for diabetes, consider being tested at a younger age or more often. PREVENTING INFECTION  Hepatitis B  If you have a higher risk for hepatitis B, you should be screened for this virus. You are considered at high risk for hepatitis B if:  You were born in a country where hepatitis B is common. Ask your health care provider which countries are considered high risk.  Your parents were born in a high-risk country, and you have not been immunized against hepatitis B (hepatitis B vaccine).  You have HIV or AIDS.  You use needles to inject street drugs.  You live with someone who has hepatitis B.  You have had sex with someone who has hepatitis B.  You get hemodialysis treatment.  You take certain medicines for conditions, including cancer, organ transplantation, and autoimmune conditions. Hepatitis C  Blood testing is recommended for:  Everyone born from [birth year] through 1963.  Anyone with known risk factors for hepatitis C. Sexually transmitted infections (STIs)  You should be screened for sexually transmitted infections (STIs) including gonorrhea and chlamydia if:  You are sexually active and are younger than 58 years of age.  You are older than 58 years of age and your health care provider tells you that you are at risk for this type of infection.  Your sexual activity has changed since you were last screened and you are at an increased risk for chlamydia or gonorrhea. Ask your health care provider if you are at risk.  If you do not have HIV, but are at risk, it may be recommended that you take a prescription medicine daily to prevent HIV infection. This is called pre-exposure prophylaxis (PrEP). You are considered at risk if:  You are sexually active and do not regularly use condoms or know the HIV status of your partner(s).  You take drugs by injection.  You are sexually  active with a partner who has HIV. Talk with your health care provider about whether you are at high risk of being infected with HIV. If you choose to begin PrEP, you should first be tested for HIV. You should then be tested every 3 months for as long as you are taking PrEP.  PREGNANCY   If you are premenopausal and you may become pregnant, ask your health care provider about preconception counseling.  If you may  become pregnant, take 400 to 800 micrograms (mcg) of folic acid every day.  If you want to prevent pregnancy, talk to your health care provider about birth control (contraception). OSTEOPOROSIS AND MENOPAUSE   Osteoporosis is a disease in which the bones lose minerals and strength with aging. This can result in serious bone fractures. Your risk for osteoporosis can be identified using a bone density scan.  If you are 5 years of age or older, or if you are at risk for osteoporosis and fractures, ask your health care provider if you should be screened.  Ask your health care provider whether you should take a calcium or vitamin D supplement to lower your risk for osteoporosis.  Menopause may have certain physical symptoms and risks.  Hormone replacement therapy may reduce some of these symptoms and risks. Talk to your health care provider about whether hormone replacement therapy is right for you.  HOME CARE INSTRUCTIONS   Schedule regular health, dental, and eye exams.  Stay current with your immunizations.   Do not use any tobacco products including cigarettes, chewing tobacco, or electronic cigarettes.  If you are pregnant, do not drink alcohol.  If you are breastfeeding, limit how much and how often you drink alcohol.  Limit alcohol intake to no more than 1 drink per day for nonpregnant women. One drink equals 12 ounces of beer, 5 ounces of wine, or 1 ounces of hard liquor.  Do not use street drugs.  Do not share needles.  Ask your health care provider for help if  you need support or information about quitting drugs.  Tell your health care provider if you often feel depressed.  Tell your health care provider if you have ever been abused or do not feel safe at home.   This information is not intended to replace advice given to you by your health care provider. Make sure you discuss any questions you have with your health care provider.   Document Released: 12/23/2010 Document Revised: 06/30/2014 Document Reviewed: 05/11/2013 Elsevier Interactive Patient Education 2016 [employer].   1. Pap smear is done 2. Mammogram is ordered 3. Stool guaiac cards are given 4. Screening lab work is ordered 5. Continue with healthy eating, exercise, and controlled weight loss 6. Continue calcium with vitamin D supplementation 7. Hydrochlorothiazide is refilled for 1 year 8. Return in 1 year for annual physical

## 2016-04-25 LAB — LIPID PANEL
CHOLESTEROL TOTAL: 214 mg/dL — AB (ref 100–199)
Chol/HDL Ratio: 4 ratio units (ref 0.0–4.4)
HDL: 53 mg/dL (ref >39)
LDL CALC: 142 mg/dL — AB (ref 0–99)
TRIGLYCERIDES: 97 mg/dL (ref 0–149)
VLDL CHOLESTEROL CAL: 19 mg/dL (ref 5–40)

## 2016-04-25 LAB — VITAMIN D 25 HYDROXY (VIT D DEFICIENCY, FRACTURES): Vit D, 25-Hydroxy: 14.3 ng/mL — ABNORMAL LOW (ref 30.0–100.0)

## 2016-04-25 LAB — HEMOGLOBIN A1C
ESTIMATED AVERAGE GLUCOSE: 114 mg/dL
Hgb A1c MFr Bld: 5.6 % (ref 4.8–5.6)

## 2016-04-25 LAB — GLUCOSE, RANDOM: GLUCOSE: 88 mg/dL (ref 65–99)

## 2016-04-25 LAB — TSH: TSH: 1.52 u[IU]/mL (ref 0.450–4.500)

## 2016-04-28 LAB — PAP IG AND HPV HIGH-RISK
HPV, high-risk: NEGATIVE
PAP SMEAR COMMENT: 0

## 2016-04-29 ENCOUNTER — Telehealth: Payer: Self-pay | Admitting: Obstetrics and Gynecology

## 2016-04-29 NOTE — Telephone Encounter (Signed)
Pt aware per vm of lab results.

## 2016-04-29 NOTE — Telephone Encounter (Signed)
PT CALLED AND WAS RETURNING YOUR CALL AND SHE WAS ON HER BREAK AND CALLED, SHE CANT PICK UP WHEN SHE IS AT WORK, SHE SAID IF YOU CALL HER YOU CAN LEAVE A MESSAGE AND SHE WILL TRY AND CALL YOU TOMORROW.

## 2016-04-30 ENCOUNTER — Telehealth: Payer: Self-pay | Admitting: Obstetrics and Gynecology

## 2016-04-30 NOTE — Telephone Encounter (Signed)
Pt aware to contact office and confirm which med she needs refilled? HCTZ?

## 2016-04-30 NOTE — Telephone Encounter (Signed)
PT CALLED AND SHE WANTED TO KNOW WHEN THE RX WILL BE CALLED IN SHE HAS CHECKED WITH THE PHARMACY THEY DO NOT HAVE IT SO SHE WANTED A CALL BACK ON WHEN THAT HAS BEEN DONE AND YOU  CAN LEAVE A MESSAGE, SHE CHECKS WITH HER PHARMACY ONLINE AND IT WASN'T SHOWING THAT IT IS READY, AND ITS SUPPOSE TO BE A YEAR SUPPLY.

## 2016-05-01 ENCOUNTER — Telehealth: Payer: Self-pay | Admitting: Obstetrics and Gynecology

## 2016-05-01 NOTE — Telephone Encounter (Signed)
Hydrochlorothyside fluid pill is the one she needs refilled.

## 2016-05-04 LAB — FECAL OCCULT BLOOD, IMMUNOCHEMICAL: FECAL OCCULT BLD: NEGATIVE

## 2016-05-05 MED ORDER — HYDROCHLOROTHIAZIDE 12.5 MG PO CAPS: 12.5000 mg | ORAL_CAPSULE | Freq: Every day | ORAL | 6 refills | Status: DC

## 2016-05-05 NOTE — Telephone Encounter (Signed)
Pt aware per vm. HCZ erx.

## 2016-05-05 NOTE — Addendum Note (Signed)
Addended by: Elouise Munroe on: 05/05/2016 09:05 AM   Modules accepted: Orders

## 2016-05-05 NOTE — Telephone Encounter (Signed)
Done. See previous telephone encounter.

## 2016-06-09 ENCOUNTER — Ambulatory Visit
Admission: RE | Admit: 2016-06-09 | Discharge: 2016-06-09 | Disposition: A | Discharge status: Home / Self Care | Payer: Managed Care, Other (non HMO) | Source: Ambulatory Visit | Attending: Obstetrics and Gynecology | Admitting: Obstetrics and Gynecology

## 2016-06-09 DIAGNOSIS — Z1231 Encounter for screening mammogram for malignant neoplasm of breast: Secondary | ICD-10-CM | POA: Diagnosis present

## 2016-11-06 ENCOUNTER — Other Ambulatory Visit: Payer: Self-pay

## 2016-11-06 MED ORDER — HYDROCHLOROTHIAZIDE 12.5 MG PO CAPS: 12.5000 mg | ORAL_CAPSULE | Freq: Every day | ORAL | 6 refills | Status: DC

## 2016-12-11 ENCOUNTER — Telehealth: Payer: Self-pay | Admitting: Obstetrics and Gynecology

## 2016-12-11 NOTE — Telephone Encounter (Signed)
Pt aware refill sent 5/17/21018. Advised to check with pharmacy.

## 2016-12-11 NOTE — Telephone Encounter (Signed)
Patient needs refill on hydrochlorothiazide, she will be out tomorrow  She requests that it include 6 refills if possible  [place]

## 2016-12-17 ENCOUNTER — Other Ambulatory Visit: Payer: Self-pay | Admitting: Unknown Physician Specialty

## 2016-12-17 DIAGNOSIS — R221 Localized swelling, mass and lump, neck: Secondary | ICD-10-CM

## 2016-12-22 ENCOUNTER — Ambulatory Visit: Payer: Managed Care, Other (non HMO)

## 2016-12-23 ENCOUNTER — Other Ambulatory Visit: Payer: Self-pay | Admitting: Unknown Physician Specialty

## 2016-12-23 DIAGNOSIS — R221 Localized swelling, mass and lump, neck: Secondary | ICD-10-CM

## 2016-12-26 ENCOUNTER — Ambulatory Visit: Payer: Managed Care, Other (non HMO)

## 2016-12-29 ENCOUNTER — Ambulatory Visit
Admission: RE | Admit: 2016-12-29 | Discharge: 2016-12-29 | Disposition: A | Discharge status: Home / Self Care | Payer: Managed Care, Other (non HMO) | Source: Ambulatory Visit | Attending: Unknown Physician Specialty | Admitting: Unknown Physician Specialty

## 2016-12-29 DIAGNOSIS — E041 Nontoxic single thyroid nodule: Secondary | ICD-10-CM | POA: Insufficient documentation

## 2016-12-29 DIAGNOSIS — R221 Localized swelling, mass and lump, neck: Secondary | ICD-10-CM

## 2016-12-29 DIAGNOSIS — R590 Localized enlarged lymph nodes: Secondary | ICD-10-CM | POA: Insufficient documentation

## 2016-12-29 DIAGNOSIS — R918 Other nonspecific abnormal finding of lung field: Secondary | ICD-10-CM | POA: Diagnosis not present

## 2016-12-29 MED ORDER — IOPAMIDOL (ISOVUE-300) INJECTION 61%
75.0000 mL | Freq: Once | INTRAVENOUS | Status: AC | PRN
  Administered 2016-12-29: 75 mL via INTRAVENOUS

## 2017-02-12 ENCOUNTER — Other Ambulatory Visit: Payer: Self-pay | Admitting: Unknown Physician Specialty

## 2017-02-12 DIAGNOSIS — E041 Nontoxic single thyroid nodule: Secondary | ICD-10-CM

## 2017-04-06 NOTE — Progress Notes (Signed)
Patient ID: Catherine Pace, female   DOB: December 18, 1955, 61 y.o.   MRN: [id number] ANNUAL PREVENTATIVE CARE GYN  ENCOUNTER NOTE  Subjective:       Catherine Pace is a 61 y.o. G0P0000 female here for a routine annual gynecologic exam.  Current complaints: 1.  thyroid nodule pcp to follow  2. Bump on rt labia   3. No cycle since 10/21/2016  3. SUI  Patient is being followed by ENT for cyst on thyroid. Dr. Ginette Pitman will be managing any thyroid disease issues. Patient is not having any significant vasomotor symptoms. Her LMP was May 2018. Patient is noted a firm nodule in the vagina recently; nontender, nonenlarged and Stress incontinence is occurring on medication, especially when she has a cold. Otherwise she is not to wear pads. Bowel function is normal.   Gynecologic History LMP- 10/21/2016 Contraception: none Last Pap: 04/24/2016 neg/neg. Results were: normal Last mammogram: 05/2016 birad 1. Results were: normal  Obstetric History OB History  Gravida Para Term Preterm AB Living  0 0 0 0 0 0  SAB TAB Ectopic Multiple Live Births  0 0 0 0          Past Medical History:  Diagnosis Date  . Anemia   . AR (allergic rhinitis)   . Endometrial polyp   . HLD (hyperlipidemia)   . Obesity     Past Surgical History:  Procedure Laterality Date  . FOOT SURGERY Right   . HYSTEROSCOPY W/D&C  2003   endometrial polyp disordered proliferative endometrium  . PAROTID GLAND TUMOR EXCISION  2009    Current Outpatient Prescriptions on File Prior to Visit  Medication Sig Dispense Refill  . aspirin 81 MG tablet Take 81 mg by mouth daily.    . calcium-vitamin D (OSCAL) 250-125 MG-UNIT tablet Take 1 tablet by mouth daily.    . Garlic 176 MG TABS Take by mouth.    . hydrochlorothiazide (MICROZIDE) 12.5 MG capsule Take 1 capsule (12.5 mg total) by mouth daily. 30 capsule 6  . vitamin E 100 UNIT capsule Take by mouth daily.     No current facility-administered medications on file prior to visit.      No Known Allergies  Social History   Social History  . Marital status: Single    Spouse name: N/A  . Number of children: N/A  . Years of education: N/A   Occupational History  . Not on file.   Social History Main Topics  . Smoking status: Never Smoker  . Smokeless tobacco: Never Used  . Alcohol use No  . Drug use: No  . Sexual activity: Not Currently    Birth control/ protection: None   Other Topics Concern  . Not on file   Social History Narrative  . No narrative on file    Family History  Problem Relation Age of Onset  . Diabetes Mother   . Hypertension Mother   . Hypertension Father   . Prostate cancer Father   . Breast cancer Maternal Aunt 1  . Diabetes Maternal Aunt   . Diabetes Other   . Breast cancer Cousin        maternal side-30's  . Colon cancer Neg Hx   . Ovarian cancer Neg Hx     The following portions of the patient's history were reviewed and updated as appropriate: allergies, current medications, past family history, past medical history, past social history, past surgical history and problem list.  Review of Systems Review of Systems  Constitutional: Negative.        No vasomotor symptoms  HENT:       Patient is being followed by ENT for thyroid cyst  Eyes: Negative.   Cardiovascular: Negative.   Gastrointestinal: Negative.   Genitourinary:       Occasional SUI, not requiring pads at this time  Musculoskeletal: Negative.   Skin: Negative.   Neurological: Negative.   Endo/Heme/Allergies: Negative.   Psychiatric/Behavioral: Negative.       Objective:  BP 127/78   Pulse 74   Ht 5\' 10"  (1.778 m)   Wt 280 lb (127 kg)   LMP 10/21/2016 (Approximate)   BMI 40.18 kg/m   CONSTITUTIONAL: Well-developed, well-nourished female in no acute distress.  PSYCHIATRIC: Normal mood and affect. Normal behavior. Normal judgment and thought content. [place]: Alert and oriented to person, place, and time. Normal muscle tone coordination. No  cranial nerve deficit noted. HENT:  Normocephalic, atraumatic, External right and left ear normal. Oropharynx is clear and moist EYES: Conjunctivae and EOM are normal. No scleral icterus.  NECK: Normal range of motion, supple, no masses.  Normal thyroid.  SKIN: Skin is warm and dry. No rash noted. Not diaphoretic. No erythema. No pallor. CARDIOVASCULAR: Normal heart rate noted, regular rhythm, no murmur. RESPIRATORY: Clear to auscultation bilaterally. Effort and breath sounds normal, no problems with respiration noted. BREASTS: Symmetric in size. No masses, skin changes, nipple drainage, or lymphadenopathy. ABDOMEN: Soft, normal bowel sounds, no distention noted.  No tenderness, rebound or guarding.  BLADDER: Normal PELVIC:  External Genitalia: Normal  BUS: Normal  Vagina: Fair estrogen effect; 2 x 3 cm right lateral sidewall cystic lesion in the distal vagina, nontender  Cervix: Normal; No cervical motion tenderness; no lesions  Uterus: Normal; Midplane, top normal size, nontender, mobile  Adnexa: Normal; nonpalpable and nontender  RV: External Exam NormaI, No Rectal Masses and Normal Sphincter tone LYMPHATIC: No Axillary, Supraclavicular, or Inguinal Adenopathy.    Assessment:   Annual gynecologic examination 61 y.o. Contraception: none Obesity 1 - Discussed diet and exercise. In a program at work working on losing weight.  Climacteric; LMP May 2018 Right distal vaginal sidewall cyst 2 x 3 cm, nontender, mobile  Plan:  Pap: Due 2020 Mammogram: Ordered Stool Guaiac Testing:  Ordered  Labs: lipid vit d tsh fbs a1c Routine preventative health maintenance measures emphasized: Exercise/Diet/Weight control, Tobacco Warnings and Alcohol/Substance use risks  Return as needed if vaginal cyst becomes painful or enlarges Return to [place], CMA  Brayton Mars, MD  Note: This dictation was prepared with Dragon dictation along with smaller phrase  technology. Any transcriptional errors that result from this process are unintentional.

## 2017-04-07 ENCOUNTER — Encounter: Payer: Self-pay | Admitting: Obstetrics and Gynecology

## 2017-04-07 ENCOUNTER — Ambulatory Visit (INDEPENDENT_AMBULATORY_CARE_PROVIDER_SITE_OTHER): Payer: Managed Care, Other (non HMO) | Admitting: Obstetrics and Gynecology

## 2017-04-07 VITALS — BP 127/78 | HR 74 | Ht 70.0 in | Wt 280.0 lb

## 2017-04-07 DIAGNOSIS — Z1211 Encounter for screening for malignant neoplasm of colon: Secondary | ICD-10-CM

## 2017-04-07 DIAGNOSIS — E785 Hyperlipidemia, unspecified: Secondary | ICD-10-CM

## 2017-04-07 DIAGNOSIS — Z1231 Encounter for screening mammogram for malignant neoplasm of breast: Secondary | ICD-10-CM

## 2017-04-07 DIAGNOSIS — N951 Menopausal and female climacteric states: Secondary | ICD-10-CM | POA: Insufficient documentation

## 2017-04-07 DIAGNOSIS — N898 Other specified noninflammatory disorders of vagina: Secondary | ICD-10-CM | POA: Diagnosis not present

## 2017-04-07 DIAGNOSIS — R638 Other symptoms and signs concerning food and fluid intake: Secondary | ICD-10-CM | POA: Diagnosis not present

## 2017-04-07 DIAGNOSIS — Z01419 Encounter for gynecological examination (general) (routine) without abnormal findings: Secondary | ICD-10-CM | POA: Diagnosis not present

## 2017-04-07 NOTE — Patient Instructions (Signed)
1. No Pap smear is needed today. Next Pap smear is due in 2020 2. Mammogram is ordered 3. Stool guaiac cards are given for colon cancer screening 4. Screening labs are obtained 5. Continue with healthy eating and exercise with controlled weight loss. Cold for weight loss this year is 12 pounds in 12 months 6. Return in 1 year for annual exam   Health Maintenance for Postmenopausal Women Menopause is a normal process in which your reproductive ability comes to an end. This process happens gradually over a span of months to years, usually between the ages of 48 and 55. Menopause is complete when you have missed 12 consecutive menstrual periods. It is important to talk with your health care provider about some of the most common conditions that affect postmenopausal women, such as heart disease, cancer, and bone loss (osteoporosis). Adopting a healthy lifestyle and getting preventive care can help to promote your health and wellness. Those actions can also lower your chances of developing some of these common conditions. What should I know about menopause? During menopause, you may experience a number of symptoms, such as:  Moderate-to-severe hot flashes.  Night sweats.  Decrease in sex drive.  Mood swings.  Headaches.  Tiredness.  Irritability.  Memory problems.  Insomnia.  Choosing to treat or not to treat menopausal changes is an individual decision that you make with your health care provider. What should I know about hormone replacement therapy and supplements? Hormone therapy products are effective for treating symptoms that are associated with menopause, such as hot flashes and night sweats. Hormone replacement carries certain risks, especially as you become older. If you are thinking about using estrogen or estrogen with progestin treatments, discuss the benefits and risks with your health care provider. What should I know about heart disease and stroke? Heart disease, heart  attack, and stroke become more likely as you age. This may be due, in part, to the hormonal changes that your body experiences during menopause. These can affect how your body processes dietary fats, triglycerides, and cholesterol. Heart attack and stroke are both medical emergencies. There are many things that you can do to help prevent heart disease and stroke:  Have your blood pressure checked at least every 1-2 years. High blood pressure causes heart disease and increases the risk of stroke.  If you are 55-79 years old, ask your health care provider if you should take aspirin to prevent a heart attack or a stroke.  Do not use any tobacco products, including cigarettes, chewing tobacco, or electronic cigarettes. If you need help quitting, ask your health care provider.  It is important to eat a healthy diet and maintain a healthy weight. ? Be sure to include plenty of vegetables, fruits, low-fat dairy products, and lean protein. ? Avoid eating foods that are high in solid fats, added sugars, or salt (sodium).  Get regular exercise. This is one of the most important things that you can do for your health. ? Try to exercise for at least 150 minutes each week. The type of exercise that you do should increase your heart rate and make you sweat. This is known as moderate-intensity exercise. ? Try to do strengthening exercises at least twice each week. Do these in addition to the moderate-intensity exercise.  Know your numbers.Ask your health care provider to check your cholesterol and your blood glucose. Continue to have your blood tested as directed by your health care provider.  What should I know about cancer screening? There   are several types of cancer. Take the following steps to reduce your risk and to catch any cancer development as early as possible. Breast Cancer  Practice breast self-awareness. ? This means understanding how your breasts normally appear and feel. ? It also means  doing regular breast self-exams. Let your health care provider know about any changes, no matter how small.  If you are 51 or older, have a clinician do a breast exam (clinical breast exam or CBE) every year. Depending on your age, family history, and medical history, it may be recommended that you also have a yearly breast X-ray (mammogram).  If you have a family history of breast cancer, talk with your health care provider about genetic screening.  If you are at high risk for breast cancer, talk with your health care provider about having an MRI and a mammogram every year.  Breast cancer (BRCA) gene test is recommended for women who have family members with BRCA-related cancers. Results of the assessment will determine the need for genetic counseling and BRCA1 and for BRCA2 testing. BRCA-related cancers include these types: ? Breast. This occurs in males or females. ? Ovarian. ? Tubal. This may also be called fallopian tube cancer. ? Cancer of the abdominal or pelvic lining (peritoneal cancer). ? Prostate. ? Pancreatic.  Cervical, Uterine, and Ovarian Cancer Your health care provider may recommend that you be screened regularly for cancer of the pelvic organs. These include your ovaries, uterus, and vagina. This screening involves a pelvic exam, which includes checking for microscopic changes to the surface of your cervix (Pap test).  For women ages 21-65, health care providers may recommend a pelvic exam and a Pap test every three years. For women ages 66-65, they may recommend the Pap test and pelvic exam, combined with testing for human papilloma virus (HPV), every five years. Some types of HPV increase your risk of cervical cancer. Testing for HPV may also be done on women of any age who have unclear Pap test results.  Other health care providers may not recommend any screening for nonpregnant women who are considered low risk for pelvic cancer and have no symptoms. Ask your health care  provider if a screening pelvic exam is right for you.  If you have had past treatment for cervical cancer or a condition that could lead to cancer, you need Pap tests and screening for cancer for at least 20 years after your treatment. If Pap tests have been discontinued for you, your risk factors (such as having a new sexual partner) need to be reassessed to determine if you should start having screenings again. Some women have medical problems that increase the chance of getting cervical cancer. In these cases, your health care provider may recommend that you have screening and Pap tests more often.  If you have a family history of uterine cancer or ovarian cancer, talk with your health care provider about genetic screening.  If you have vaginal bleeding after reaching menopause, tell your health care provider.  There are currently no reliable tests available to screen for ovarian cancer.  Lung Cancer Lung cancer screening is recommended for adults 28-41 years old who are at high risk for lung cancer because of a history of smoking. A yearly low-dose CT scan of the lungs is recommended if you:  Currently smoke.  Have a history of at least 30 pack-years of smoking and you currently smoke or have quit within the past 15 years. A pack-year is smoking an average  of one pack of cigarettes per day for one year.  Yearly screening should:  Continue until it has been 15 years since you quit.  Stop if you develop a health problem that would prevent you from having lung cancer treatment.  Colorectal Cancer  This type of cancer can be detected and can often be prevented.  Routine colorectal cancer screening usually begins at age 50 and continues through age 75.  If you have risk factors for colon cancer, your health care provider may recommend that you be screened at an earlier age.  If you have a family history of colorectal cancer, talk with your health care provider about genetic  screening.  Your health care provider may also recommend using home test kits to check for hidden blood in your stool.  A small camera at the end of a tube can be used to examine your colon directly (sigmoidoscopy or colonoscopy). This is done to check for the earliest forms of colorectal cancer.  Direct examination of the colon should be repeated every 5-10 years until age 75. However, if early forms of precancerous polyps or small growths are found or if you have a family history or genetic risk for colorectal cancer, you may need to be screened more often.  Skin Cancer  Check your skin from head to toe regularly.  Monitor any moles. Be sure to tell your health care provider: ? About any new moles or changes in moles, especially if there is a change in a mole's shape or color. ? If you have a mole that is larger than the size of a pencil eraser.  If any of your family members has a history of skin cancer, especially at a young age, talk with your health care provider about genetic screening.  Always use sunscreen. Apply sunscreen liberally and repeatedly throughout the day.  Whenever you are outside, protect yourself by wearing long sleeves, pants, a wide-brimmed hat, and sunglasses.  What should I know about osteoporosis? Osteoporosis is a condition in which bone destruction happens more quickly than new bone creation. After menopause, you may be at an increased risk for osteoporosis. To help prevent osteoporosis or the bone fractures that can happen because of osteoporosis, the following is recommended:  If you are 19-50 years old, get at least 1,000 mg of calcium and at least 600 mg of vitamin D per day.  If you are older than age 50 but younger than age 70, get at least 1,200 mg of calcium and at least 600 mg of vitamin D per day.  If you are older than age 70, get at least 1,200 mg of calcium and at least 800 mg of vitamin D per day.  Smoking and excessive alcohol intake  increase the risk of osteoporosis. Eat foods that are rich in calcium and vitamin D, and do weight-bearing exercises several times each week as directed by your health care provider. What should I know about how menopause affects my mental health? Depression may occur at any age, but it is more common as you become older. Common symptoms of depression include:  Low or sad mood.  Changes in sleep patterns.  Changes in appetite or eating patterns.  Feeling an overall lack of motivation or enjoyment of activities that you previously enjoyed.  Frequent crying spells.  Talk with your health care provider if you think that you are experiencing depression. What should I know about immunizations? It is important that you get and maintain your immunizations. These   include:  Tetanus, diphtheria, and pertussis (Tdap) booster vaccine.  Influenza every year before the flu season begins.  Pneumonia vaccine.  Shingles vaccine.  Your health care provider may also recommend other immunizations. This information is not intended to replace advice given to you by your health care provider. Make sure you discuss any questions you have with your health care provider. Document Released: 08/01/2005 Document Revised: 12/28/2015 Document Reviewed: 03/13/2015 Elsevier Interactive Patient Education  2018 [employer].

## 2017-04-08 LAB — LIPID PANEL
Chol/HDL Ratio: 4.2 ratio (ref 0.0–4.4)
Cholesterol, Total: 222 mg/dL — ABNORMAL HIGH (ref 100–199)
HDL: 53 mg/dL (ref >39)
LDL Calculated: 152 mg/dL — ABNORMAL HIGH (ref 0–99)
Triglycerides: 84 mg/dL (ref 0–149)
VLDL CHOLESTEROL CAL: 17 mg/dL (ref 5–40)

## 2017-04-08 LAB — HEMOGLOBIN A1C
Est. average glucose Bld gHb Est-mCnc: 128 mg/dL
Hgb A1c MFr Bld: 6.1 % — ABNORMAL HIGH (ref 4.8–5.6)

## 2017-04-08 LAB — TSH: TSH: 1.7 u[IU]/mL (ref 0.450–4.500)

## 2017-04-08 LAB — GLUCOSE, RANDOM: Glucose: 101 mg/dL — ABNORMAL HIGH (ref 65–99)

## 2017-04-19 LAB — SPECIMEN STATUS REPORT

## 2017-04-19 LAB — FECAL OCCULT BLOOD, IMMUNOCHEMICAL: FECAL OCCULT BLD: NEGATIVE

## 2017-09-22 ENCOUNTER — Ambulatory Visit: Payer: Managed Care, Other (non HMO)

## 2018-04-01 NOTE — Progress Notes (Signed)
Patient ID: Catherine Pace, female   DOB: 08/22/1955, 62 y.o.   MRN: [id number] ANNUAL PREVENTATIVE CARE GYN  ENCOUNTER NOTE  Subjective:       Catherine Pace is a 62 y.o. G0P0000 female here for a routine annual gynecologic exam.  Current complaints: 1.  none  Catherine Pace reports having difficulty with weight loss.  She admits to intermittent exercise and is attempting to follow a good diet; she had questions about the keto diet today. No major interval health issues have been identified with the exception of possible thyroid disease.  She does have an appointment with  Dr. Ginette Pitman who will be managing any thyroid disease issues. Patient is not having any significant vasomotor symptoms. Her LMP was 05/2017. Bowel function is normal.  Bladder function is normal.   Gynecologic History LMP- 06/16/2017 Contraception: none Last Pap: 04/24/2016 neg/neg. Last mammogram: 05/2016 birad 1.  Obstetric History OB History  Gravida Para Term Preterm AB Living  0 0 0 0 0 0  SAB TAB Ectopic Multiple Live Births  0 0 0 0      Past Medical History:  Diagnosis Date  . Anemia   . AR (allergic rhinitis)   . Endometrial polyp   . HLD (hyperlipidemia)   . Obesity   . Thyroid nodule     Past Surgical History:  Procedure Laterality Date  . FOOT SURGERY Right   . HYSTEROSCOPY W/D&C  2003   endometrial polyp disordered proliferative endometrium  . PAROTID GLAND TUMOR EXCISION  2009    Current Outpatient Medications on File Prior to Visit  Medication Sig Dispense Refill  . aspirin 81 MG tablet Take 81 mg by mouth daily.    . hydrochlorothiazide (MICROZIDE) 12.5 MG capsule Take 1 capsule (12.5 mg total) by mouth daily. 30 capsule 6   No current facility-administered medications on file prior to visit.     No Known Allergies  Social History   Socioeconomic History  . Marital status: Single    Spouse name: Not on file  . Number of children: Not on file  . Years of education: Not on file  . Highest  education level: Not on file  Occupational History  . Not on file  Social Needs  . Financial resource strain: Not on file  . Food insecurity:    Worry: Not on file    Inability: Not on file  . Transportation needs:    Medical: Not on file    Non-medical: Not on file  Tobacco Use  . Smoking status: Never Smoker  . Smokeless tobacco: Never Used  Substance and Sexual Activity  . Alcohol use: No  . Drug use: No  . Sexual activity: Not Currently    Birth control/protection: None  Lifestyle  . Physical activity:    Days per week: Not on file    Minutes per session: Not on file  . Stress: Not on file  Relationships  . Social connections:    Talks on phone: Not on file    Gets together: Not on file    Attends religious service: Not on file    Active member of club or organization: Not on file    Attends meetings of clubs or organizations: Not on file    Relationship status: Not on file  . Intimate partner violence:    Fear of current or ex partner: Not on file    Emotionally abused: Not on file    Physically abused: Not on file  Forced sexual activity: Not on file  Other Topics Concern  . Not on file  Social History Narrative  . Not on file    Family History  Problem Relation Age of Onset  . Diabetes Mother   . Hypertension Mother   . Hypertension Father   . Prostate cancer Father   . Breast cancer Maternal Aunt 80  . Diabetes Maternal Aunt   . Diabetes Other   . Breast cancer Cousin        maternal side-30's  . Colon cancer Neg Hx   . Ovarian cancer Neg Hx     The following portions of the patient's history were reviewed and updated as appropriate: allergies, current medications, past family history, past medical history, past social history, past surgical history and problem list.  Review of Systems Review of Systems  Constitutional: Negative.   HENT: Negative.   Eyes: Negative.   Respiratory: Negative.   Cardiovascular: Negative.   Gastrointestinal:  Negative.   Genitourinary: Negative.   Musculoskeletal: Negative.   Skin: Negative.   Neurological: Negative.   Endo/Heme/Allergies: Negative.   Psychiatric/Behavioral: Negative.        Objective:  BP 133/81   Pulse 76   Ht 5\' 10"  (1.778 m)   Wt 272 lb 1.6 oz (123.4 kg)   LMP 06/16/2017 (Exact Date)   BMI 39.04 kg/m   CONSTITUTIONAL: Well-developed, well-nourished female in no acute distress.  PSYCHIATRIC: Normal mood and affect. Normal behavior. Normal judgment and thought content. Catherine Pace: Alert and oriented to person, place, and time. Normal muscle tone coordination. No cranial nerve deficit noted. HENT:  Normocephalic, atraumatic, External right and left ear normal.  EYES: Conjunctivae and EOM are normal. No scleral icterus.  NECK: Normal range of motion, supple, no masses.  Normal thyroid.  SKIN: Skin is warm and dry. No rash noted. Not diaphoretic. No erythema. No pallor. CARDIOVASCULAR: Normal heart rate noted, regular rhythm, no murmur. RESPIRATORY: Clear to auscultation bilaterally. Effort and breath sounds normal, no problems with respiration noted. BREASTS: Symmetric in size. No masses, skin changes, nipple drainage, or lymphadenopathy. ABDOMEN: Soft, no distention noted.  No tenderness, rebound or guarding.  BLADDER: Normal PELVIC:  External Genitalia: Normal  BUS: Normal  Vagina: Fair estrogen effect  Cervix: Normal; No cervical motion tenderness; no lesions  Uterus: Midplane, top normal size, nontender, mobile  Adnexa: Normal; nonpalpable and nontender  RV: External Exam NormaI, No Rectal Masses and Normal Sphincter tone LYMPHATIC: No Axillary, Supraclavicular, or Inguinal Adenopathy.    Assessment:   Annual gynecologic examination 62 y.o. Contraception: post menopausal status BMI 39 - Discussed diet and exercise. Referral to the [facility] in [place] is given-Dr. Leafy Ro   Plan:  Pap: Due 2020 Mammogram: Ordered Stool  Guaiac Testing:  Refer for colonoscopy Labs: lipid vit d tsh fbs a1c Routine preventative health maintenance measures emphasized: Exercise/Diet/Weight control, Tobacco Warnings and Alcohol/Substance use risks  Return to [place], CMA  Brayton Mars, MD   Note: This dictation was prepared with Dragon dictation along with smaller phrase technology. Any transcriptional errors that result from this process are unintentional.

## 2018-04-08 ENCOUNTER — Encounter: Payer: Self-pay | Admitting: Obstetrics and Gynecology

## 2018-04-08 ENCOUNTER — Telehealth: Payer: Self-pay | Admitting: Obstetrics and Gynecology

## 2018-04-08 ENCOUNTER — Ambulatory Visit (INDEPENDENT_AMBULATORY_CARE_PROVIDER_SITE_OTHER): Payer: Managed Care, Other (non HMO) | Admitting: Obstetrics and Gynecology

## 2018-04-08 VITALS — BP 133/81 | HR 76 | Ht 70.0 in | Wt 272.1 lb

## 2018-04-08 DIAGNOSIS — Z6839 Body mass index (BMI) 39.0-39.9, adult: Secondary | ICD-10-CM

## 2018-04-08 DIAGNOSIS — Z01419 Encounter for gynecological examination (general) (routine) without abnormal findings: Secondary | ICD-10-CM | POA: Diagnosis not present

## 2018-04-08 DIAGNOSIS — I10 Essential (primary) hypertension: Secondary | ICD-10-CM

## 2018-04-08 DIAGNOSIS — Z1231 Encounter for screening mammogram for malignant neoplasm of breast: Secondary | ICD-10-CM | POA: Diagnosis not present

## 2018-04-08 DIAGNOSIS — Z780 Asymptomatic menopausal state: Secondary | ICD-10-CM

## 2018-04-08 DIAGNOSIS — Z1211 Encounter for screening for malignant neoplasm of colon: Secondary | ICD-10-CM

## 2018-04-08 MED ORDER — HYDROCHLOROTHIAZIDE 12.5 MG PO CAPS: 12.5000 mg | ORAL_CAPSULE | Freq: Every day | ORAL | 1 refills | Status: AC

## 2018-04-08 NOTE — Telephone Encounter (Signed)
The patient is asking if a 6 month refill supply of her HCTZ can be sent to [facility] today if possible, as she forgot to mention this when she was here today, please advise, thanks.

## 2018-04-08 NOTE — Telephone Encounter (Signed)
Med erx. Pt aware.

## 2018-04-08 NOTE — Patient Instructions (Signed)
1.  Pap smear is not done.  Next Pap smear is due 2020. 2.  Mammogram is ordered. 3.  Stool guaiac card testing is given for colon cancer screening. 4.  Screening labs are ordered. 5.  Continue with healthy eating, exercise, and control weight loss 6.  Return in 1 year for annual exam-Dr. Toco Maintenance for Postmenopausal Women Menopause is a normal process in which your reproductive ability comes to an end. This process happens gradually over a span of months to years, usually between the ages of 79 and 72. Menopause is complete when you have missed 12 consecutive menstrual periods. It is important to talk with your health care provider about some of the most common conditions that affect postmenopausal women, such as heart disease, cancer, and bone loss (osteoporosis). Adopting a healthy lifestyle and getting preventive care can help to promote your health and wellness. Those actions can also lower your chances of developing some of these common conditions. What should I know about menopause? During menopause, you may experience a number of symptoms, such as:  Moderate-to-severe hot flashes.  Night sweats.  Decrease in sex drive.  Mood swings.  Headaches.  Tiredness.  Irritability.  Memory problems.  Insomnia.  Choosing to treat or not to treat menopausal changes is an individual decision that you make with your health care provider. What should I know about hormone replacement therapy and supplements? Hormone therapy products are effective for treating symptoms that are associated with menopause, such as hot flashes and night sweats. Hormone replacement carries certain risks, especially as you become older. If you are thinking about using estrogen or estrogen with progestin treatments, discuss the benefits and risks with your health care provider. What should I know about heart disease and stroke? Heart disease, heart attack, and stroke become more likely as you  age. This may be due, in part, to the hormonal changes that your body experiences during menopause. These can affect how your body processes dietary fats, triglycerides, and cholesterol. Heart attack and stroke are both medical emergencies. There are many things that you can do to help prevent heart disease and stroke:  Have your blood pressure checked at least every 1-2 years. High blood pressure causes heart disease and increases the risk of stroke.  If you are 83-63 years old, ask your health care provider if you should take aspirin to prevent a heart attack or a stroke.  Do not use any tobacco products, including cigarettes, chewing tobacco, or electronic cigarettes. If you need help quitting, ask your health care provider.  It is important to eat a healthy diet and maintain a healthy weight. ? Be sure to include plenty of vegetables, fruits, low-fat dairy products, and lean protein. ? Avoid eating foods that are high in solid fats, added sugars, or salt (sodium).  Get regular exercise. This is one of the most important things that you can do for your health. ? Try to exercise for at least 150 minutes each week. The type of exercise that you do should increase your heart rate and make you sweat. This is known as moderate-intensity exercise. ? Try to do strengthening exercises at least twice each week. Do these in addition to the moderate-intensity exercise.  Know your numbers.Ask your health care provider to check your cholesterol and your blood glucose. Continue to have your blood tested as directed by your health care provider.  What should I know about cancer screening? There are several types of cancer. Take  the following steps to reduce your risk and to catch any cancer development as early as possible. Breast Cancer  Practice breast self-awareness. ? This means understanding how your breasts normally appear and feel. ? It also means doing regular breast self-exams. Let your health  care provider know about any changes, no matter how small.  If you are 63 or older, have a clinician do a breast exam (clinical breast exam or CBE) every year. Depending on your age, family history, and medical history, it may be recommended that you also have a yearly breast X-ray (mammogram).  If you have a family history of breast cancer, talk with your health care provider about genetic screening.  If you are at high risk for breast cancer, talk with your health care provider about having an MRI and a mammogram every year.  Breast cancer (BRCA) gene test is recommended for women who have family members with BRCA-related cancers. Results of the assessment will determine the need for genetic counseling and BRCA1 and for BRCA2 testing. BRCA-related cancers include these types: ? Breast. This occurs in males or females. ? Ovarian. ? Tubal. This may also be called fallopian tube cancer. ? Cancer of the abdominal or pelvic lining (peritoneal cancer). ? Prostate. ? Pancreatic.  Cervical, Uterine, and Ovarian Cancer Your health care provider may recommend that you be screened regularly for cancer of the pelvic organs. These include your ovaries, uterus, and vagina. This screening involves a pelvic exam, which includes checking for microscopic changes to the surface of your cervix (Pap test).  For women ages 21-65, health care providers may recommend a pelvic exam and a Pap test every three years. For women ages 70-65, they may recommend the Pap test and pelvic exam, combined with testing for human papilloma virus (HPV), every five years. Some types of HPV increase your risk of cervical cancer. Testing for HPV may also be done on women of any age who have unclear Pap test results.  Other health care providers may not recommend any screening for nonpregnant women who are considered low risk for pelvic cancer and have no symptoms. Ask your health care provider if a screening pelvic exam is right for  you.  If you have had past treatment for cervical cancer or a condition that could lead to cancer, you need Pap tests and screening for cancer for at least 20 years after your treatment. If Pap tests have been discontinued for you, your risk factors (such as having a new sexual partner) need to be reassessed to determine if you should start having screenings again. Some women have medical problems that increase the chance of getting cervical cancer. In these cases, your health care provider may recommend that you have screening and Pap tests more often.  If you have a family history of uterine cancer or ovarian cancer, talk with your health care provider about genetic screening.  If you have vaginal bleeding after reaching menopause, tell your health care provider.  There are currently no reliable tests available to screen for ovarian cancer.  Lung Cancer Lung cancer screening is recommended for adults 67-4 years old who are at high risk for lung cancer because of a history of smoking. A yearly low-dose CT scan of the lungs is recommended if you:  Currently smoke.  Have a history of at least 30 pack-years of smoking and you currently smoke or have quit within the past 15 years. A pack-year is smoking an average of one pack of cigarettes per  day for one year.  Yearly screening should:  Continue until it has been 15 years since you quit.  Stop if you develop a health problem that would prevent you from having lung cancer treatment.  Colorectal Cancer  This type of cancer can be detected and can often be prevented.  Routine colorectal cancer screening usually begins at age 56 and continues through age 34.  If you have risk factors for colon cancer, your health care provider may recommend that you be screened at an earlier age.  If you have a family history of colorectal cancer, talk with your health care provider about genetic screening.  Your health care provider may also recommend  using home test kits to check for hidden blood in your stool.  A small camera at the end of a tube can be used to examine your colon directly (sigmoidoscopy or colonoscopy). This is done to check for the earliest forms of colorectal cancer.  Direct examination of the colon should be repeated every 5-10 years until age 66. However, if early forms of precancerous polyps or small growths are found or if you have a family history or genetic risk for colorectal cancer, you may need to be screened more often.  Skin Cancer  Check your skin from head to toe regularly.  Monitor any moles. Be sure to tell your health care provider: ? About any new moles or changes in moles, especially if there is a change in a mole's shape or color. ? If you have a mole that is larger than the size of a pencil eraser.  If any of your family members has a history of skin cancer, especially at a young age, talk with your health care provider about genetic screening.  Always use sunscreen. Apply sunscreen liberally and repeatedly throughout the day.  Whenever you are outside, protect yourself by wearing long sleeves, pants, a wide-brimmed hat, and sunglasses.  What should I know about osteoporosis? Osteoporosis is a condition in which bone destruction happens more quickly than new bone creation. After menopause, you may be at an increased risk for osteoporosis. To help prevent osteoporosis or the bone fractures that can happen because of osteoporosis, the following is recommended:  If you are 36-50 years old, get at least 1,000 mg of calcium and at least 600 mg of vitamin D per day.  If you are older than age 65 but younger than age 58, get at least 1,200 mg of calcium and at least 600 mg of vitamin D per day.  If you are older than age 45, get at least 1,200 mg of calcium and at least 800 mg of vitamin D per day.  Smoking and excessive alcohol intake increase the risk of osteoporosis. Eat foods that are rich in  calcium and vitamin D, and do weight-bearing exercises several times each week as directed by your health care provider. What should I know about how menopause affects my mental health? Depression may occur at any age, but it is more common as you become older. Common symptoms of depression include:  Low or sad mood.  Changes in sleep patterns.  Changes in appetite or eating patterns.  Feeling an overall lack of motivation or enjoyment of activities that you previously enjoyed.  Frequent crying spells.  Talk with your health care provider if you think that you are experiencing depression. What should I know about immunizations? It is important that you get and maintain your immunizations. These include:  Tetanus, diphtheria, and pertussis (  Tdap) booster vaccine.  Influenza every year before the flu season begins.  Pneumonia vaccine.  Shingles vaccine.  Your health care provider may also recommend other immunizations. This information is not intended to replace advice given to you by your health care provider. Make sure you discuss any questions you have with your health care provider. Document Released: 08/01/2005 Document Revised: 12/28/2015 Document Reviewed: 03/13/2015 Elsevier Interactive Patient Education  2018 [employer].

## 2018-04-09 LAB — TSH: TSH: 1.27 u[IU]/mL (ref 0.450–4.500)

## 2018-04-09 LAB — HEMOGLOBIN A1C
Est. average glucose Bld gHb Est-mCnc: 126 mg/dL
Hgb A1c MFr Bld: 6 % — ABNORMAL HIGH (ref 4.8–5.6)

## 2018-04-09 LAB — LIPID PANEL
CHOL/HDL RATIO: 3.8 ratio (ref 0.0–4.4)
Cholesterol, Total: 210 mg/dL — ABNORMAL HIGH (ref 100–199)
HDL: 56 mg/dL (ref >39)
LDL CALC: 137 mg/dL — AB (ref 0–99)
TRIGLYCERIDES: 83 mg/dL (ref 0–149)
VLDL Cholesterol Cal: 17 mg/dL (ref 5–40)

## 2018-04-09 LAB — VITAMIN D 25 HYDROXY (VIT D DEFICIENCY, FRACTURES): Vit D, 25-Hydroxy: 11.4 ng/mL — ABNORMAL LOW (ref 30.0–100.0)

## 2018-04-09 LAB — GLUCOSE, RANDOM: Glucose: 87 mg/dL (ref 65–99)

## 2018-04-19 ENCOUNTER — Other Ambulatory Visit: Payer: Self-pay

## 2018-04-19 DIAGNOSIS — Z1211 Encounter for screening for malignant neoplasm of colon: Secondary | ICD-10-CM

## 2018-05-07 ENCOUNTER — Ambulatory Visit
Admission: RE | Admit: 2018-05-07 | Discharge: 2018-05-07 | Disposition: A | Discharge status: Home / Self Care | Payer: Managed Care, Other (non HMO) | Source: Ambulatory Visit | Attending: Obstetrics and Gynecology | Admitting: Obstetrics and Gynecology

## 2018-05-07 DIAGNOSIS — Z1231 Encounter for screening mammogram for malignant neoplasm of breast: Secondary | ICD-10-CM | POA: Insufficient documentation

## 2018-05-13 ENCOUNTER — Encounter: Payer: Self-pay | Admitting: Student

## 2018-05-14 ENCOUNTER — Ambulatory Visit: Payer: Managed Care, Other (non HMO) | Admitting: Certified Registered Nurse Anesthetist

## 2018-05-14 ENCOUNTER — Encounter: Payer: Self-pay | Admitting: *Deleted

## 2018-05-14 ENCOUNTER — Encounter
Admission: RE | Disposition: A | Discharge status: Home / Self Care | Payer: Self-pay | Source: Ambulatory Visit | Attending: Gastroenterology

## 2018-05-14 ENCOUNTER — Ambulatory Visit
Admission: RE | Admit: 2018-05-14 | Discharge: 2018-05-14 | Disposition: A | Discharge status: Home / Self Care | Payer: Managed Care, Other (non HMO) | Source: Ambulatory Visit | Attending: Gastroenterology | Admitting: Gastroenterology

## 2018-05-14 DIAGNOSIS — Z1211 Encounter for screening for malignant neoplasm of colon: Secondary | ICD-10-CM | POA: Diagnosis present

## 2018-05-14 DIAGNOSIS — D175 Benign lipomatous neoplasm of intra-abdominal organs: Secondary | ICD-10-CM

## 2018-05-14 DIAGNOSIS — D125 Benign neoplasm of sigmoid colon: Secondary | ICD-10-CM | POA: Insufficient documentation

## 2018-05-14 DIAGNOSIS — D1779 Benign lipomatous neoplasm of other sites: Secondary | ICD-10-CM | POA: Diagnosis not present

## 2018-05-14 DIAGNOSIS — Z79899 Other long term (current) drug therapy: Secondary | ICD-10-CM | POA: Insufficient documentation

## 2018-05-14 DIAGNOSIS — K5730 Diverticulosis of large intestine without perforation or abscess without bleeding: Secondary | ICD-10-CM | POA: Diagnosis not present

## 2018-05-14 DIAGNOSIS — K648 Other hemorrhoids: Secondary | ICD-10-CM | POA: Diagnosis not present

## 2018-05-14 DIAGNOSIS — D122 Benign neoplasm of ascending colon: Secondary | ICD-10-CM

## 2018-05-14 DIAGNOSIS — Z87891 Personal history of nicotine dependence: Secondary | ICD-10-CM | POA: Insufficient documentation

## 2018-05-14 DIAGNOSIS — Z7982 Long term (current) use of aspirin: Secondary | ICD-10-CM | POA: Diagnosis not present

## 2018-05-14 DIAGNOSIS — E785 Hyperlipidemia, unspecified: Secondary | ICD-10-CM | POA: Insufficient documentation

## 2018-05-14 DIAGNOSIS — K635 Polyp of colon: Secondary | ICD-10-CM

## 2018-05-14 HISTORY — PX: COLONOSCOPY WITH PROPOFOL: SHX5780

## 2018-05-14 SURGERY — COLONOSCOPY WITH PROPOFOL
Anesthesia: General

## 2018-05-14 MED ORDER — PROPOFOL 10 MG/ML IV BOLUS
INTRAVENOUS | Status: DC | PRN
  Administered 2018-05-14: 80 mg via INTRAVENOUS

## 2018-05-14 MED ORDER — LIDOCAINE HCL (PF) 1 % IJ SOLN
INTRAMUSCULAR | Status: AC
  Filled 2018-05-14: qty 2

## 2018-05-14 MED ORDER — SODIUM CHLORIDE 0.9 % IV SOLN
INTRAVENOUS | Status: DC
  Administered 2018-05-14: 11:00:00 via INTRAVENOUS

## 2018-05-14 MED ORDER — PROPOFOL 10 MG/ML IV BOLUS
INTRAVENOUS | Status: AC
  Filled 2018-05-14: qty 20

## 2018-05-14 MED ORDER — PHENYLEPHRINE HCL 10 MG/ML IJ SOLN
INTRAMUSCULAR | Status: DC | PRN
  Administered 2018-05-14 (×2): 50 ug via INTRAVENOUS

## 2018-05-14 MED ORDER — PROPOFOL 500 MG/50ML IV EMUL
INTRAVENOUS | Status: DC | PRN
  Administered 2018-05-14: 160 ug/kg/min via INTRAVENOUS

## 2018-05-14 NOTE — Anesthesia Post-op Follow-up Note (Signed)
Anesthesia QCDR form completed.        

## 2018-05-14 NOTE — H&P (Signed)
Catherine Antigua, MD [address] [place], [place], [place], [address] [id number], [place], [place], [place], [address] Phone: [phone]  Fax: [phone]  Primary Care Physician:  Defrancesco, Alanda Slim, MD   Pre-Procedure History & Physical: HPI:  Catherine Pace is a 62 y.o. female is here for a colonoscopy.   Past Medical History:  Diagnosis Date  . Anemia   . AR (allergic rhinitis)   . Endometrial polyp   . HLD (hyperlipidemia)   . Thyroid nodule     Past Surgical History:  Procedure Laterality Date  . DILATION AND CURETTAGE OF UTERUS    . FOOT SURGERY Right   . HYSTEROSCOPY W/D&C  2003   endometrial polyp disordered proliferative endometrium  . PAROTID GLAND TUMOR EXCISION  2009    Prior to Admission medications   Medication Sig Start Date End Date Taking? Authorizing Provider  aspirin 81 MG tablet Take 81 mg by mouth daily.   Yes [provider]  hydrochlorothiazide (MICROZIDE) 12.5 MG capsule Take 1 capsule (12.5 mg total) by mouth daily. 04/08/18  Yes Defrancesco, Alanda Slim, MD    Allergies as of 04/19/2018  . (No Known Allergies)    Family History  Problem Relation Age of Onset  . Diabetes Mother   . Hypertension Mother   . Hypertension Father   . Prostate cancer Father   . Breast cancer Maternal Aunt 67  . Diabetes Maternal Aunt   . Diabetes Other   . Breast cancer Cousin        maternal side-30's  . Colon cancer Neg Hx   . Ovarian cancer Neg Hx     Social History   Socioeconomic History  . Marital status: Single    Spouse name: Not on file  . Number of children: Not on file  . Years of education: Not on file  . Highest education level: Not on file  Occupational History  . Not on file  Social Needs  . Financial resource strain: Not on file  . Food insecurity:    Worry: Not on file    Inability: Not on file  . Transportation needs:    Medical: Not on file    Non-medical: Not on file  Tobacco Use  . Smoking status:  Former Smoker    Last attempt to quit: 1994    Years since quitting: 25.9  . Smokeless tobacco: Never Used  Substance and Sexual Activity  . Alcohol use: Not Currently  . Drug use: No  . Sexual activity: Not Currently    Birth control/protection: None  Lifestyle  . Physical activity:    Days per week: 2 days    Minutes per session: 30 min  . Stress: Not on file  Relationships  . Social connections:    Talks on phone: Not on file    Gets together: Not on file    Attends religious service: Not on file    Active member of club or organization: Not on file    Attends meetings of clubs or organizations: Not on file    Relationship status: Not on file  . Intimate partner violence:    Fear of current or ex partner: Not on file    Emotionally abused: Not on file    Physically abused: Not on file    Forced sexual activity: Not on file  Other Topics Concern  . Not on file  Social History Narrative  . Not on file    Review of Systems: See  HPI, otherwise negative ROS  Physical Exam: BP (!) 158/91   Pulse 66   Temp (!) 96.7 F (35.9 C) (Tympanic)   Resp 20   Ht 5\' 10"  (1.778 m)   Wt 123.4 kg   SpO2 99%   BMI 39.03 kg/m  General:   Alert,  pleasant and cooperative in NAD Head:  Normocephalic and atraumatic. Neck:  Supple; no masses or thyromegaly. Lungs:  Clear throughout to auscultation, normal respiratory effort.    Heart:  +S1, +S2, Regular rate and rhythm, No edema. Abdomen:  Soft, nontender and nondistended. Normal bowel sounds, without guarding, and without rebound.   Neurologic:  Alert and  oriented x4;  grossly normal neurologically.  Impression/Plan: [name] is here for a colonoscopy to be performed for average risk screening.  Risks, benefits, limitations, and alternatives regarding  colonoscopy have been reviewed with the patient.  Questions have been answered.  All parties agreeable.   Virgel Manifold, MD  05/14/2018, 10:23 AM

## 2018-05-14 NOTE — Anesthesia Preprocedure Evaluation (Signed)
Anesthesia Evaluation  Patient identified by MRN, date of birth, ID band Patient awake    Reviewed: Allergy & Precautions, NPO status , Patient's Chart, lab work & pertinent test results  History of Anesthesia Complications Negative for: history of anesthetic complications  Airway Mallampati: II  TM Distance: >3 FB Neck ROM: Full    Dental  (+) Partial Lower, Partial Upper   Pulmonary neg sleep apnea, neg COPD, former smoker,    breath sounds clear to auscultation- rhonchi (-) wheezing      Cardiovascular hypertension, (-) CAD, (-) Past MI, (-) Cardiac Stents and (-) CABG  Rhythm:Regular Rate:Normal - Systolic murmurs and - Diastolic murmurs    Neuro/Psych negative neurological ROS  negative psych ROS   GI/Hepatic negative GI ROS, Neg liver ROS,   Endo/Other  negative endocrine ROSneg diabetes  Renal/GU negative Renal ROS     Musculoskeletal negative musculoskeletal ROS (+)   Abdominal (+) + obese,   Peds  Hematology  (+) anemia ,   Anesthesia Other Findings Past Medical History: No date: Anemia No date: AR (allergic rhinitis) No date: Endometrial polyp No date: HLD (hyperlipidemia) No date: Thyroid nodule   Reproductive/Obstetrics                             Anesthesia Physical Anesthesia Plan  ASA: II  Anesthesia Plan: General   Post-op Pain Management:    Induction: Intravenous  PONV Risk Score and Plan: 2 and Propofol infusion  Airway Management Planned: Natural Airway  Additional Equipment:   Intra-op Plan:   Post-operative Plan:   Informed Consent: I have reviewed the patients History and Physical, chart, labs and discussed the procedure including the risks, benefits and alternatives for the proposed anesthesia with the patient or authorized representative who has indicated his/her understanding and acceptance.   Dental advisory given  Plan Discussed with: CRNA  and Anesthesiologist  Anesthesia Plan Comments:         Anesthesia Quick Evaluation

## 2018-05-14 NOTE — Op Note (Signed)
[facility] Gastroenterology Patient Name: Catherine Pace Procedure Date: 05/14/2018 10:48 AM MRN: [id number] Account #: [id number] Date of Birth: [birth year] Admit Type: Outpatient Age: 62 Room: [facility] ENDO ROOM 2 Gender: Female Note Status: Finalized Procedure:            Colonoscopy Indications:          Screening for colorectal malignant neoplasm Providers:             B. Bonna Gains MD, MD Referring MD:         Forest Gleason Md, MD (Referring MD) Medicines:            Monitored Anesthesia Care Complications:        No immediate complications. Procedure:            Pre-Anesthesia Assessment:                       - ASA Grade Assessment: II - A patient with mild                        systemic disease.                       - Prior to the procedure, a History and Physical was                        performed, and patient medications, allergies and                        sensitivities were reviewed. The patient's tolerance of                        previous anesthesia was reviewed.                       - The risks and benefits of the procedure and the                        sedation options and risks were discussed with the                        patient. All questions were answered and informed                        consent was obtained.                       - Patient identification and proposed procedure were                        verified prior to the procedure by the physician, the                        nurse, the anesthesiologist, the anesthetist and the                        technician. The procedure was verified in the procedure                        room.  After obtaining informed consent, the colonoscope was                        passed under direct vision. Throughout the procedure,                        the patient's blood pressure, pulse, and oxygen                        saturations were monitored continuously. The                         Colonoscope was introduced through the anus and                        advanced to the the cecum, identified by appendiceal                        orifice and ileocecal valve. The colonoscopy was                        performed with ease. The patient tolerated the                        procedure well. The quality of the bowel preparation                        was good. Findings:      The perianal and digital rectal examinations were normal.      Two sessile polyps were found in the sigmoid colon and ascending colon.       The polyps were 4 to 5 mm in size. These polyps were removed with a cold       snare. Resection and retrieval were complete.      There was a medium-sized lipoma, in the ascending colon. Biopsies were       taken with a cold forceps for histology.      Multiple diverticula were found in the sigmoid colon.      The exam was otherwise without abnormality.      The rectum, sigmoid colon, descending colon, transverse colon, ascending       colon and cecum appeared normal.      Non-bleeding internal hemorrhoids were found during retroflexion. Impression:           - Two 4 to 5 mm polyps in the sigmoid colon and in the                        ascending colon, removed with a cold snare. Resected                        and retrieved.                       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 Biopsied.                       - Diverticulosis in the sigmoid colon.                       - The examination was otherwise normal.                       -  The rectum, sigmoid colon, descending colon,                        transverse colon, ascending colon and cecum are normal.                       - Non-bleeding internal hemorrhoids. Recommendation:       - Discharge patient to home (with escort).                       - High fiber diet.                       - Advance diet as tolerated.                       - Continue present medications.                        - Await pathology results.                       - The findings and recommendations were discussed with                        the patient.                       - The findings and recommendations were discussed with                        the patient's family.                       - Return to primary care physician as previously                        scheduled.                       - If the pathology report reveals adenomatous tissue,                        then repeat the colonoscopy for surveillance in 5 years.                       - If the pathology report indicates hyperplastic polyp,                        then repeat colonoscopy for screening purposes in 10                        years. Procedure Code(s):    --- Professional ---                       [phone], Colonoscopy, flexible; with removal of tumor(s),                        polyp(s), or other lesion(s) by snare technique                       47654, 59, Colonoscopy, flexible; with biopsy, single  or multiple Diagnosis Code(s):    --- Professional ---                       Z12.11, Encounter for screening for malignant neoplasm                        of colon                       D12.5, Benign neoplasm of sigmoid colon                       D12.2, Benign neoplasm of ascending colon                       D17.5, Benign lipomatous neoplasm of intra-abdominal                        organs                       K64.8, Other hemorrhoids                       K57.30, Diverticulosis of large intestine without                        perforation or abscess without bleeding CPT copyright 2018 American Medical Association. All rights reserved. The codes documented in this report are preliminary and upon coder review may  be revised to meet current compliance requirements.  Vonda Antigua, MD Margretta Sidle B. Bonna Gains MD, MD 05/14/2018 11:26:25 AM This report has been signed electronically. Number of Addenda:  0 Note Initiated On: 05/14/2018 10:48 AM Scope Withdrawal Time: 0 hours 21 minutes 1 second  Total Procedure Duration: 0 hours 26 minutes 51 seconds  Estimated Blood Loss: Estimated blood loss: none.      [facility]

## 2018-05-14 NOTE — Transfer of Care (Signed)
Immediate Anesthesia Transfer of Care Note  Patient: Catherine Pace  Procedure(s) Performed: COLONOSCOPY WITH PROPOFOL (N/A )  Patient Location: PACU  Anesthesia Type:General  Level of Consciousness: drowsy  Airway & Oxygen Therapy: Patient Spontanous Breathing and Patient connected to nasal cannula oxygen  Post-op Assessment: Report given to RN and Post -op Vital signs reviewed and stable  Post vital signs: Reviewed and stable  Last Vitals:  Vitals Value Taken Time  BP    Temp    Pulse 85 05/14/2018 11:28 AM  Resp 20 05/14/2018 11:28 AM  SpO2 100 % 05/14/2018 11:28 AM  Vitals shown include unvalidated device data.  Last Pain:  Vitals:   05/14/18 1004  TempSrc: Tympanic  PainSc: 0-No pain         Complications: No apparent anesthesia complications

## 2018-05-14 NOTE — Anesthesia Postprocedure Evaluation (Signed)
Anesthesia Post Note  Patient: GIULIETTA PROKOP  Procedure(s) Performed: COLONOSCOPY WITH PROPOFOL (N/A )  Patient location during evaluation: Endoscopy Anesthesia Type: General Level of consciousness: awake and alert and oriented Pain management: pain level controlled Vital Signs Assessment: post-procedure vital signs reviewed and stable Respiratory status: spontaneous breathing, nonlabored ventilation and respiratory function stable Cardiovascular status: blood pressure returned to baseline and stable Postop Assessment: no signs of nausea or vomiting Anesthetic complications: no     Last Vitals:  Vitals:   05/14/18 1004 05/14/18 1120  BP: (!) 158/91 (!) 112/46  Pulse: 66 83  Resp: 20 20  Temp: (!) 35.9 C (!) 36.1 C  SpO2: 99% 100%    Last Pain:  Vitals:   05/14/18 1120  TempSrc: Tympanic  PainSc:                  Carlis Blanchard

## 2018-05-14 NOTE — Anesthesia Procedure Notes (Signed)
Performed by: Mckyla Deckman, CRNA Pre-anesthesia Checklist: Patient identified, Emergency Drugs available, Suction available, Patient being monitored and Timeout performed Patient Re-evaluated:Patient Re-evaluated prior to induction Oxygen Delivery Method: Nasal cannula Induction Type: IV induction       

## 2018-05-17 ENCOUNTER — Encounter: Payer: Self-pay | Admitting: Gastroenterology

## 2018-05-17 LAB — SURGICAL PATHOLOGY

## 2018-05-24 ENCOUNTER — Encounter: Payer: Self-pay | Admitting: Obstetrics and Gynecology

## 2018-05-24 DIAGNOSIS — Z9889 Other specified postprocedural states: Secondary | ICD-10-CM | POA: Insufficient documentation

## 2019-03-21 ENCOUNTER — Other Ambulatory Visit: Payer: Self-pay

## 2019-03-21 ENCOUNTER — Ambulatory Visit
Admission: RE | Admit: 2019-03-21 | Discharge: 2019-03-21 | Disposition: A | Discharge status: Home / Self Care | Payer: 59 | Source: Ambulatory Visit | Attending: Internal Medicine | Admitting: Internal Medicine

## 2019-03-21 ENCOUNTER — Other Ambulatory Visit: Payer: Self-pay | Admitting: Internal Medicine

## 2019-03-21 DIAGNOSIS — M7989 Other specified soft tissue disorders: Secondary | ICD-10-CM | POA: Insufficient documentation

## 2019-03-22 ENCOUNTER — Other Ambulatory Visit: Payer: Self-pay

## 2019-03-22 DIAGNOSIS — Z20822 Contact with and (suspected) exposure to covid-19: Secondary | ICD-10-CM

## 2019-03-23 LAB — NOVEL CORONAVIRUS, NAA: SARS-CoV-2, NAA: NOT DETECTED

## 2019-04-14 ENCOUNTER — Other Ambulatory Visit: Payer: Self-pay | Admitting: Internal Medicine

## 2019-04-14 DIAGNOSIS — Z1231 Encounter for screening mammogram for malignant neoplasm of breast: Secondary | ICD-10-CM

## 2019-05-12 ENCOUNTER — Ambulatory Visit
Admission: RE | Admit: 2019-05-12 | Discharge: 2019-05-12 | Disposition: A | Discharge status: Home / Self Care | Payer: 59 | Source: Ambulatory Visit | Attending: Internal Medicine | Admitting: Internal Medicine

## 2019-05-12 DIAGNOSIS — Z1231 Encounter for screening mammogram for malignant neoplasm of breast: Secondary | ICD-10-CM | POA: Insufficient documentation

## 2019-08-30 ENCOUNTER — Other Ambulatory Visit: Payer: Self-pay | Admitting: Unknown Physician Specialty

## 2019-08-30 DIAGNOSIS — E041 Nontoxic single thyroid nodule: Secondary | ICD-10-CM

## 2019-09-02 ENCOUNTER — Other Ambulatory Visit: Payer: Self-pay

## 2019-09-02 ENCOUNTER — Ambulatory Visit
Admission: RE | Admit: 2019-09-02 | Discharge: 2019-09-02 | Disposition: A | Discharge status: Home / Self Care | Payer: 59 | Source: Ambulatory Visit | Attending: Unknown Physician Specialty | Admitting: Unknown Physician Specialty

## 2019-09-02 ENCOUNTER — Ambulatory Visit: Payer: 59

## 2019-09-02 DIAGNOSIS — E041 Nontoxic single thyroid nodule: Secondary | ICD-10-CM | POA: Diagnosis not present

## 2019-09-06 ENCOUNTER — Other Ambulatory Visit: Payer: Self-pay | Admitting: Unknown Physician Specialty

## 2019-09-06 DIAGNOSIS — E041 Nontoxic single thyroid nodule: Secondary | ICD-10-CM

## 2020-03-22 ENCOUNTER — Other Ambulatory Visit: Payer: Self-pay | Admitting: Internal Medicine

## 2020-03-22 DIAGNOSIS — Z1231 Encounter for screening mammogram for malignant neoplasm of breast: Secondary | ICD-10-CM

## 2020-05-15 ENCOUNTER — Ambulatory Visit
Admission: RE | Admit: 2020-05-15 | Discharge: 2020-05-15 | Disposition: A | Discharge status: Home / Self Care | Payer: No Typology Code available for payment source | Source: Ambulatory Visit | Attending: Internal Medicine | Admitting: Internal Medicine

## 2020-05-15 ENCOUNTER — Other Ambulatory Visit: Payer: Self-pay

## 2020-05-15 DIAGNOSIS — Z1231 Encounter for screening mammogram for malignant neoplasm of breast: Secondary | ICD-10-CM

## 2020-09-03 ENCOUNTER — Ambulatory Visit
Admission: RE | Admit: 2020-09-03 | Discharge: 2020-09-03 | Disposition: A | Discharge status: Home / Self Care | Payer: 59 | Source: Ambulatory Visit | Attending: Unknown Physician Specialty | Admitting: Unknown Physician Specialty

## 2020-09-03 ENCOUNTER — Other Ambulatory Visit: Payer: Self-pay

## 2020-09-03 DIAGNOSIS — E041 Nontoxic single thyroid nodule: Secondary | ICD-10-CM | POA: Diagnosis present

## 2020-09-04 ENCOUNTER — Other Ambulatory Visit (HOSPITAL_COMMUNITY): Payer: Self-pay | Admitting: Unknown Physician Specialty

## 2020-09-04 ENCOUNTER — Other Ambulatory Visit: Payer: Self-pay | Admitting: Unknown Physician Specialty

## 2020-09-04 DIAGNOSIS — E041 Nontoxic single thyroid nodule: Secondary | ICD-10-CM

## 2020-09-21 ENCOUNTER — Other Ambulatory Visit: Payer: Self-pay | Admitting: Unknown Physician Specialty

## 2020-09-21 DIAGNOSIS — E041 Nontoxic single thyroid nodule: Secondary | ICD-10-CM

## 2021-03-06 ENCOUNTER — Ambulatory Visit
Admission: RE | Admit: 2021-03-06 | Discharge: 2021-03-06 | Disposition: A | Discharge status: Home / Self Care | Payer: 59 | Source: Ambulatory Visit | Attending: Unknown Physician Specialty | Admitting: Unknown Physician Specialty

## 2021-03-06 ENCOUNTER — Other Ambulatory Visit: Payer: Self-pay

## 2021-03-06 DIAGNOSIS — E041 Nontoxic single thyroid nodule: Secondary | ICD-10-CM | POA: Diagnosis present

## 2021-03-08 ENCOUNTER — Other Ambulatory Visit: Payer: Self-pay | Admitting: Unknown Physician Specialty

## 2021-03-08 DIAGNOSIS — E041 Nontoxic single thyroid nodule: Secondary | ICD-10-CM

## 2021-09-03 ENCOUNTER — Ambulatory Visit: Payer: 59

## 2022-03-07 ENCOUNTER — Ambulatory Visit
Admission: RE | Admit: 2022-03-07 | Discharge: 2022-03-07 | Disposition: A | Discharge status: Home / Self Care | Payer: 59 | Source: Ambulatory Visit | Attending: Unknown Physician Specialty | Admitting: Unknown Physician Specialty

## 2022-03-07 DIAGNOSIS — E041 Nontoxic single thyroid nodule: Secondary | ICD-10-CM | POA: Diagnosis present

## 2022-03-12 ENCOUNTER — Other Ambulatory Visit: Payer: Self-pay | Admitting: Unknown Physician Specialty

## 2022-03-12 DIAGNOSIS — E041 Nontoxic single thyroid nodule: Secondary | ICD-10-CM

## 2022-08-08 IMAGING — MG DIGITAL SCREENING BILAT W/ TOMO W/ CAD
6 of 12 series · 6 of 36 positions shown · non-contrast
Comparison: Previous exam(s).

ACR Breast Density Category a: The breast tissue is almost entirely
fatty.

CLINICAL DATA: Screening.

EXAM:
DIGITAL SCREENING BILATERAL MAMMOGRAM WITH TOMO AND CAD

[R MLO synth-2D (1 of 2)]
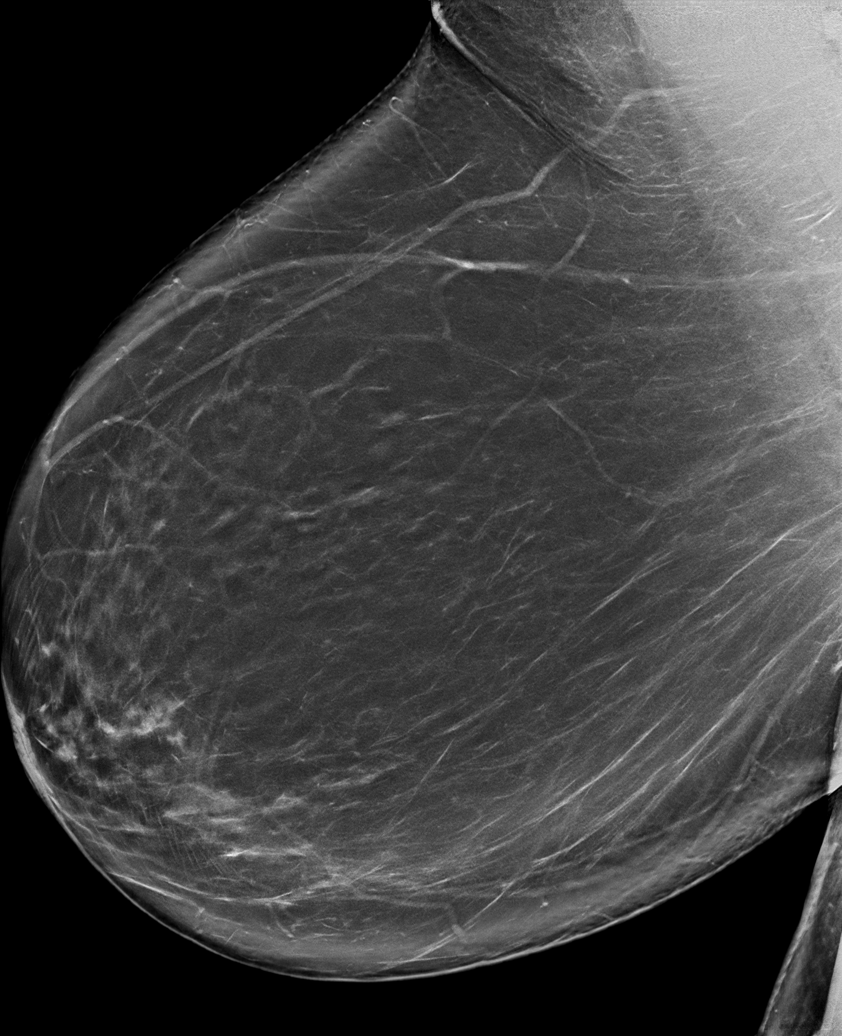

[R MLO synth-2D (2 of 2)]
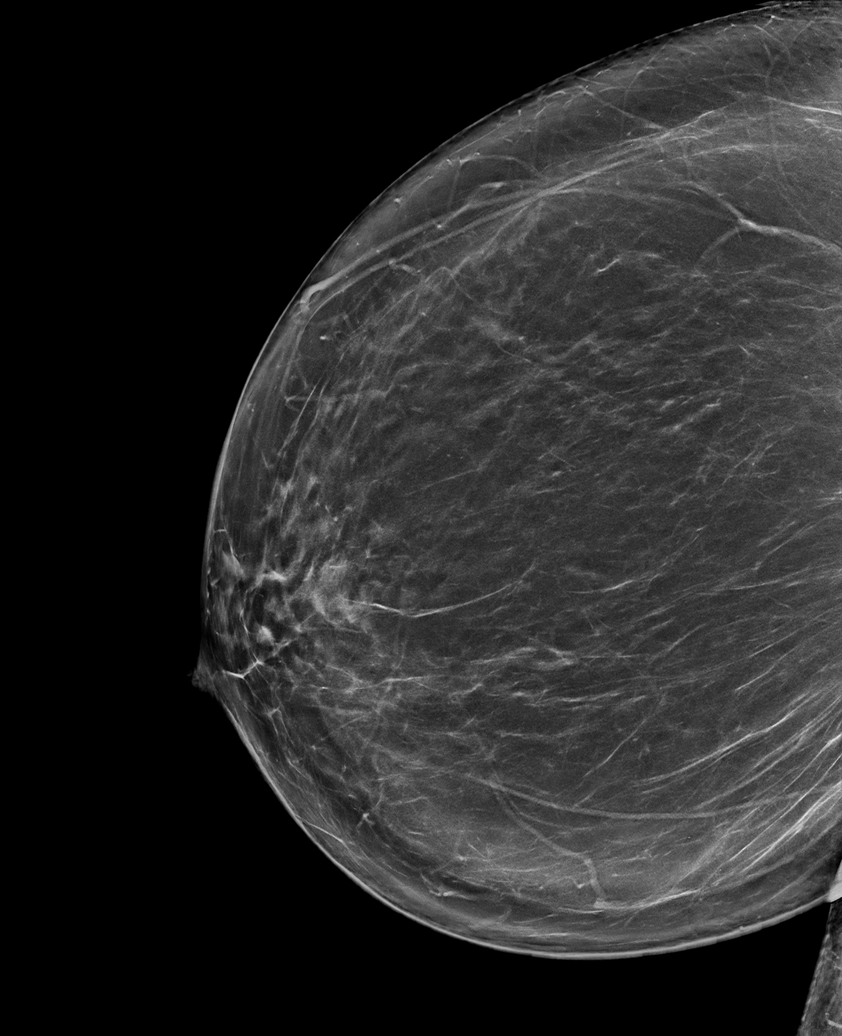

[R CC synth-2D]
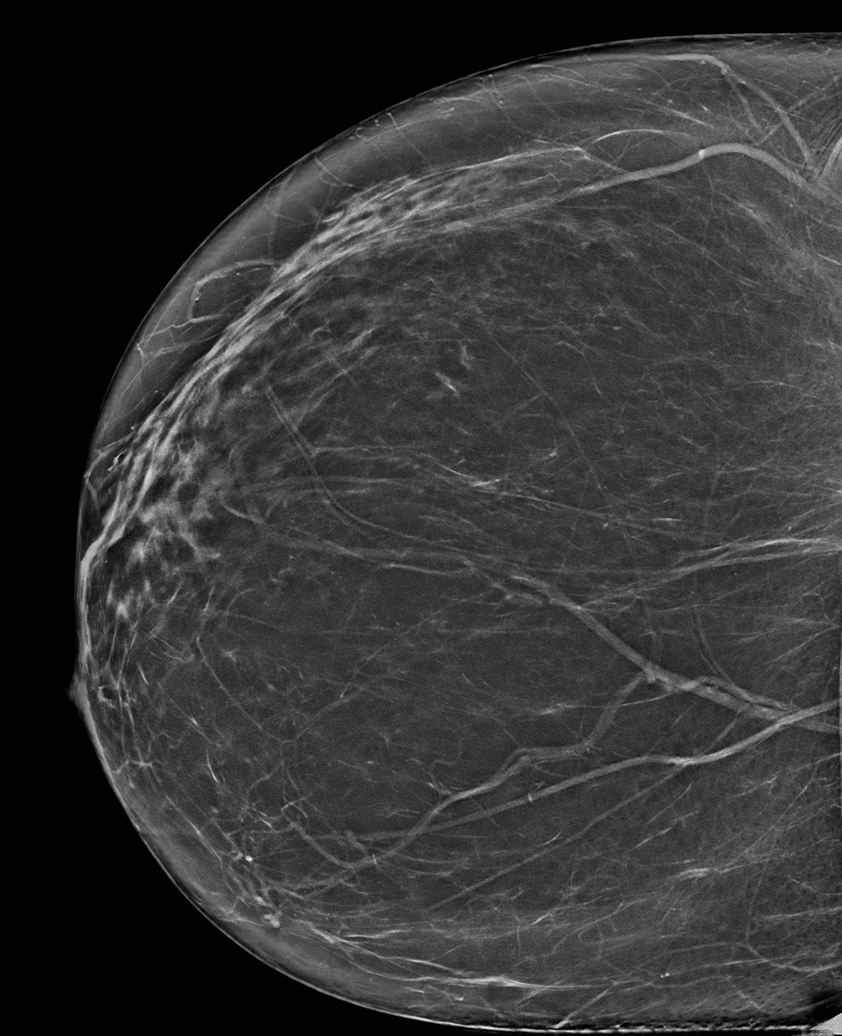

[L MLO synth-2D]
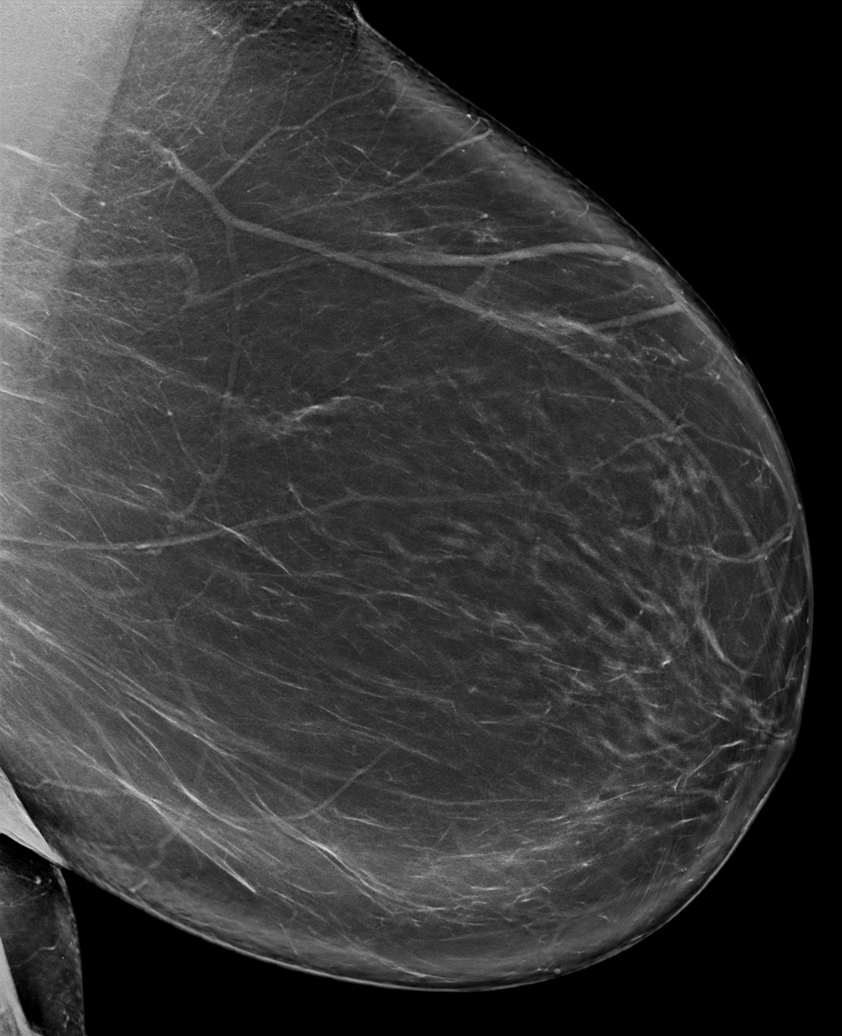

[L CC synth-2D (1 of 2)]
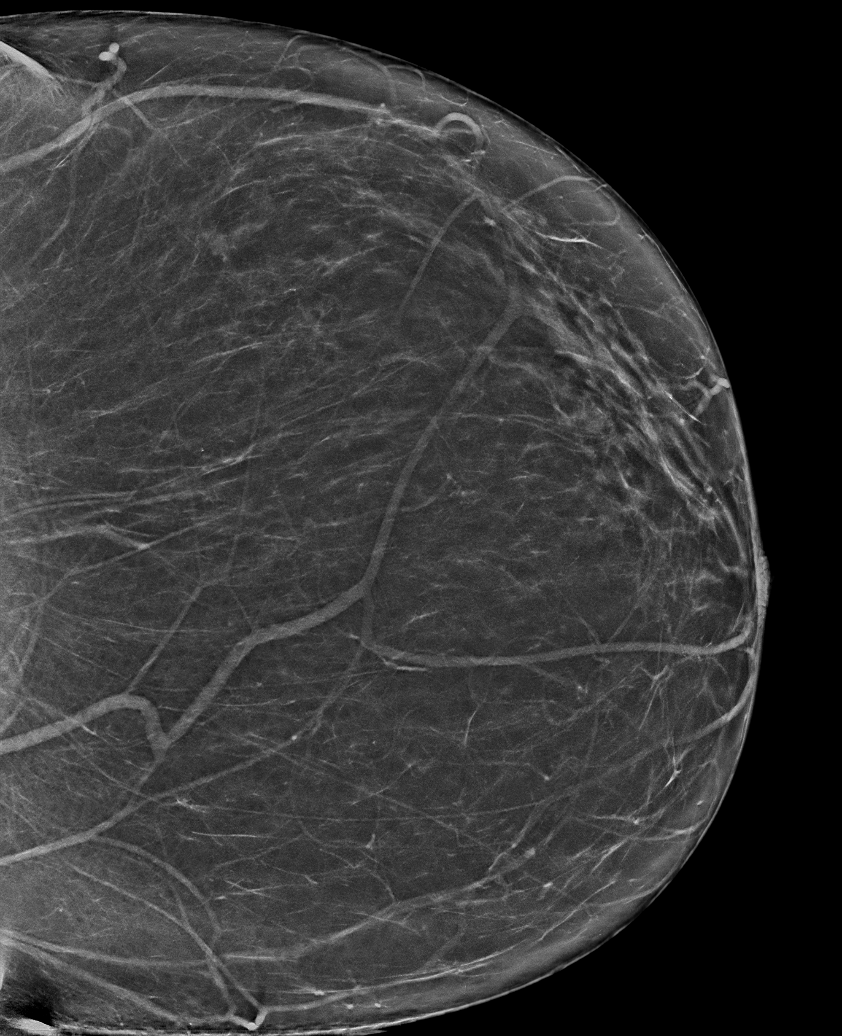

[L CC synth-2D (2 of 2)]
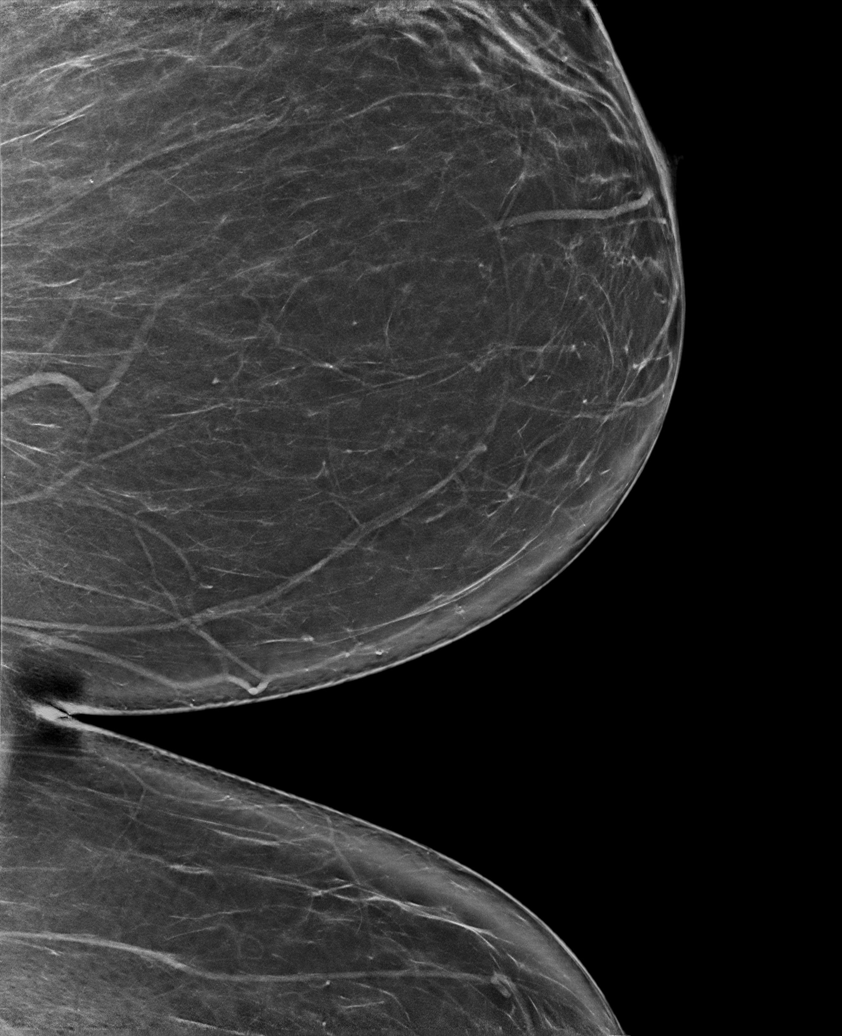

[6 of 36 positions shown; findings below may reference images not displayed]

FINDINGS: There are no findings suspicious for malignancy. Images were
processed with CAD.
IMPRESSION: No mammographic evidence of malignancy. A result letter of this
screening mammogram will be mailed directly to the patient.

RECOMMENDATION:
Screening mammogram in one year. (Code:8Y-[id number])

BI-RADS CATEGORY  1: Negative.

## 2022-08-14 ENCOUNTER — Other Ambulatory Visit: Payer: Self-pay | Admitting: Internal Medicine

## 2022-08-14 DIAGNOSIS — Z13820 Encounter for screening for osteoporosis: Secondary | ICD-10-CM

## 2022-08-15 ENCOUNTER — Other Ambulatory Visit: Payer: Self-pay | Admitting: Internal Medicine

## 2022-08-15 DIAGNOSIS — Z1231 Encounter for screening mammogram for malignant neoplasm of breast: Secondary | ICD-10-CM

## 2022-09-30 ENCOUNTER — Ambulatory Visit
Admission: RE | Admit: 2022-09-30 | Discharge: 2022-09-30 | Disposition: A | Discharge status: Home / Self Care | Payer: Medicare HMO | Source: Ambulatory Visit | Attending: Internal Medicine | Admitting: Internal Medicine

## 2022-09-30 DIAGNOSIS — Z780 Asymptomatic menopausal state: Secondary | ICD-10-CM | POA: Insufficient documentation

## 2022-09-30 DIAGNOSIS — Z1231 Encounter for screening mammogram for malignant neoplasm of breast: Secondary | ICD-10-CM | POA: Diagnosis present

## 2022-09-30 DIAGNOSIS — Z13820 Encounter for screening for osteoporosis: Secondary | ICD-10-CM

## 2023-03-09 ENCOUNTER — Ambulatory Visit
Admission: RE | Admit: 2023-03-09 | Discharge: 2023-03-09 | Disposition: A | Discharge status: Home / Self Care | Payer: Medicare HMO | Source: Ambulatory Visit | Attending: Unknown Physician Specialty | Admitting: Unknown Physician Specialty

## 2023-03-09 DIAGNOSIS — E041 Nontoxic single thyroid nodule: Secondary | ICD-10-CM | POA: Diagnosis present

## 2023-05-30 IMAGING — US US THYROID
1 series · 13 of 25 positions shown · non-contrast
Comparison: Thyroid ultrasound, 09/03/2020 and 09/02/2019. CT neck,
12/29/2016.

CLINICAL DATA: Prior ultrasound follow-up.

EXAM:
THYROID ULTRASOUND
TECHNIQUE: Ultrasound examination of the thyroid gland and adjacent soft
tissues was performed.

[Series 1: us thyroid · 0.06mm/px · 13 of 69 slices shown]
[im 1/69]
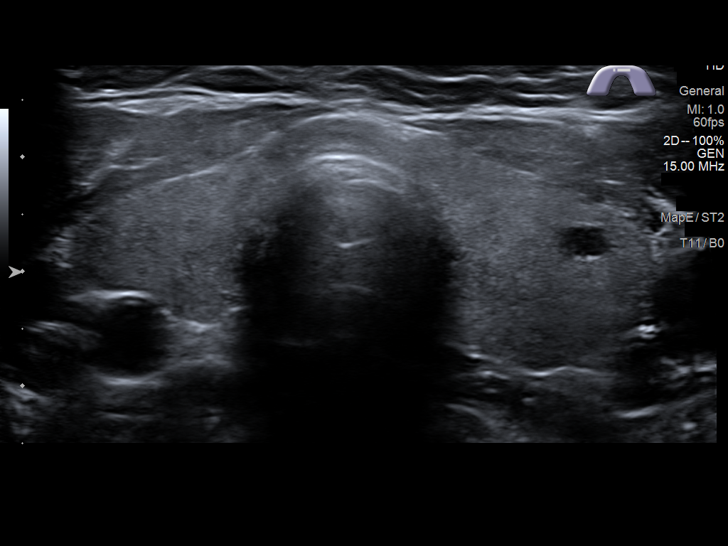
[im 6/69]
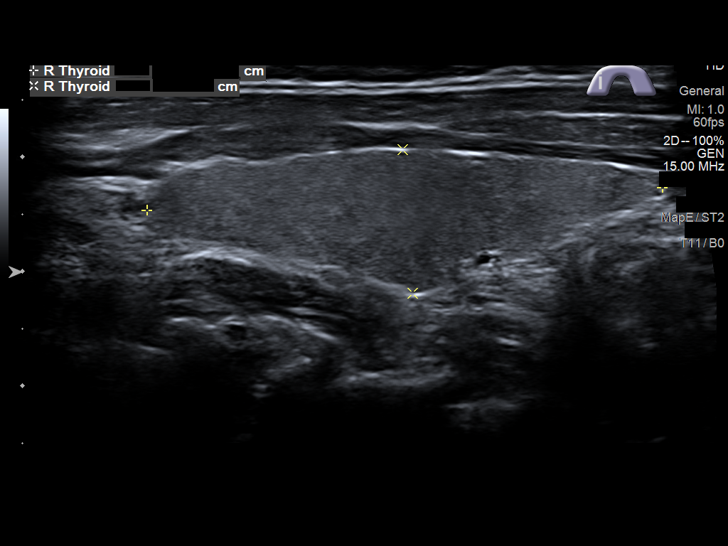
[im 12/69]
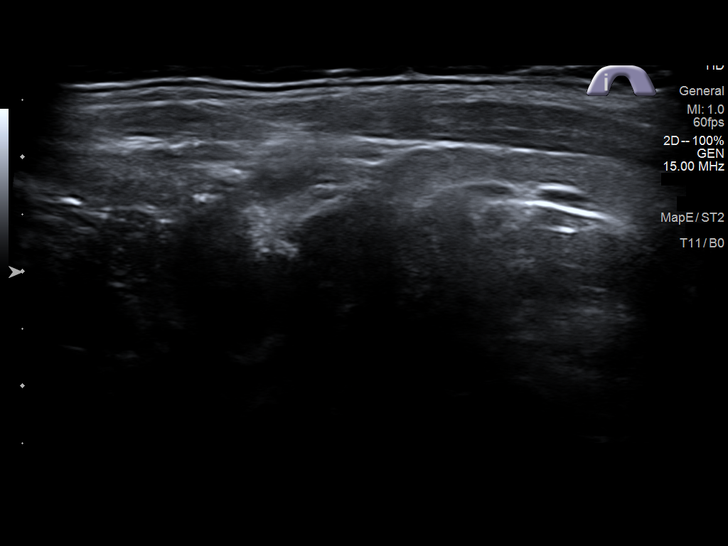
[im 18/69]
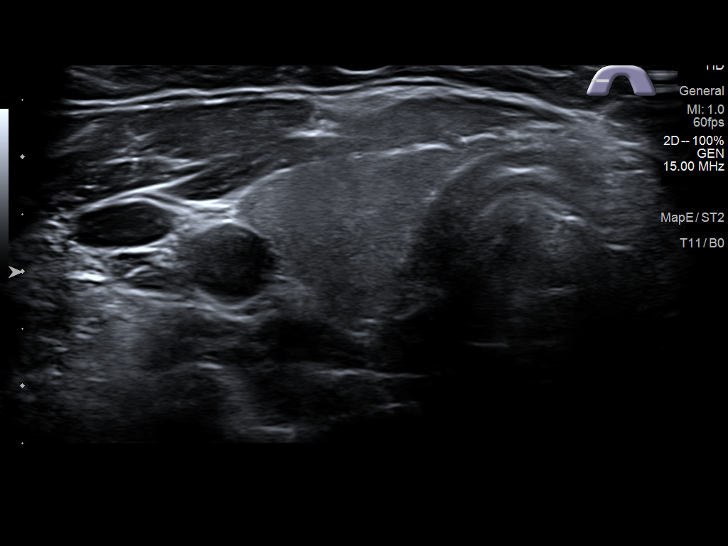
[im 23/69]
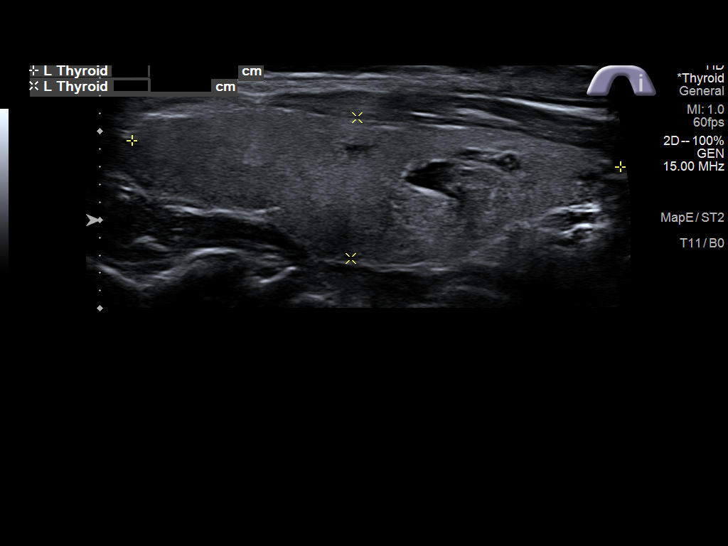
[im 29/69]
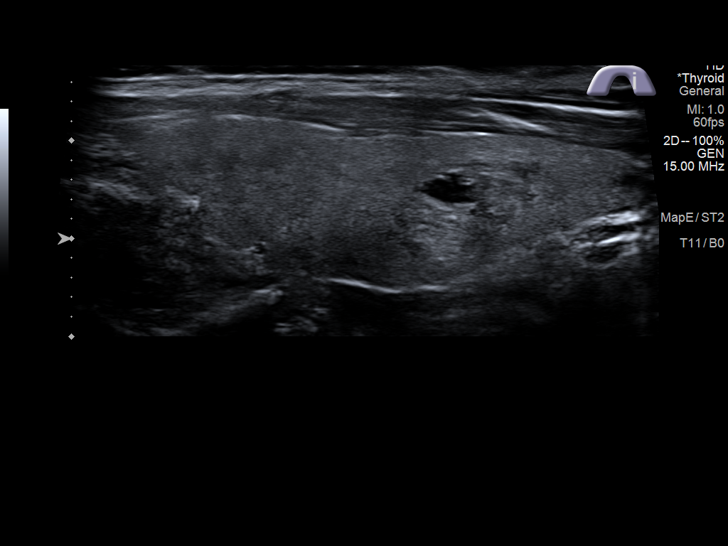
[im 35/69]
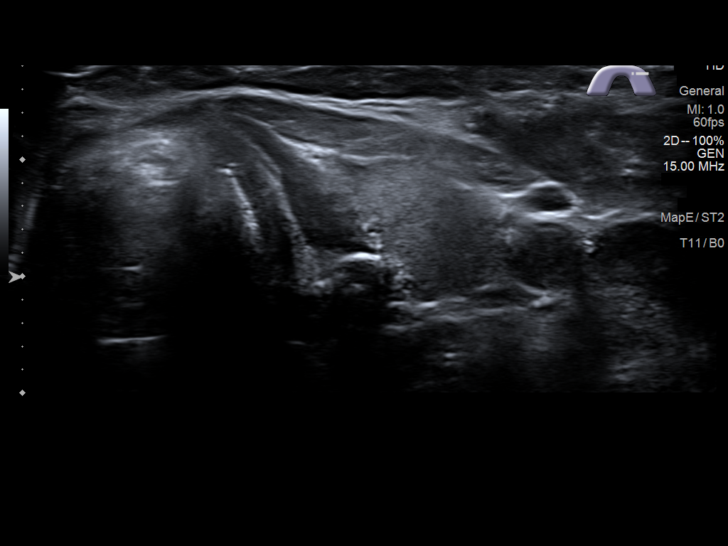
[im 40/69]
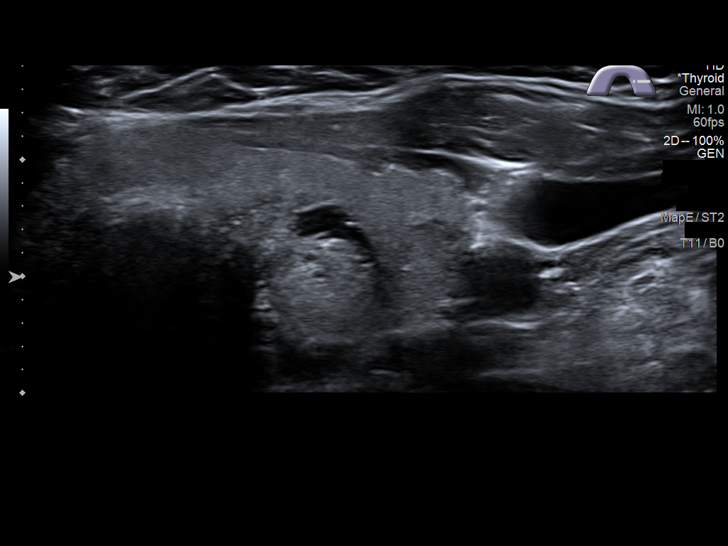
[im 46/69]
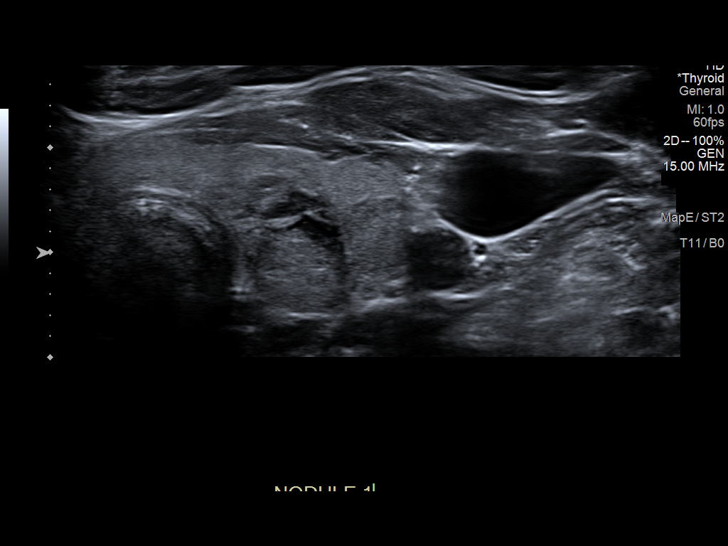
[im 52/69]
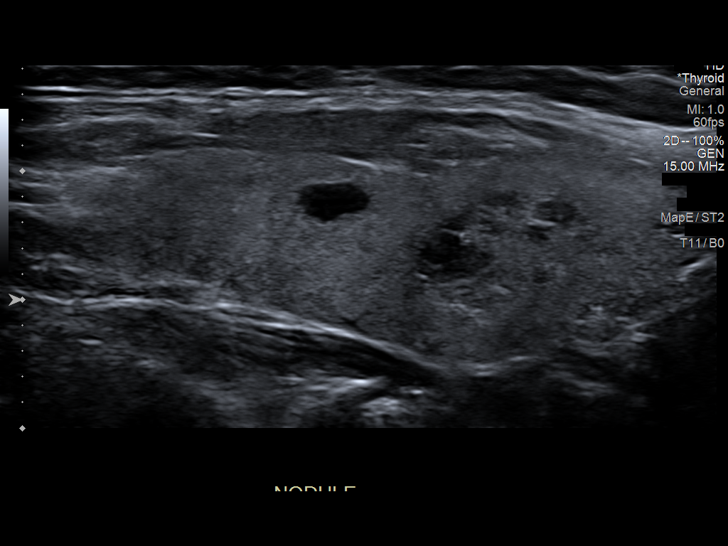
[im 57/69]
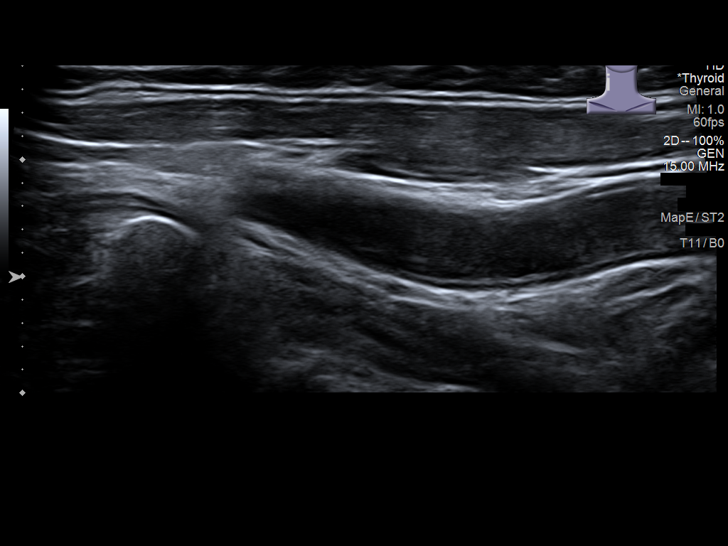
[im 63/69]
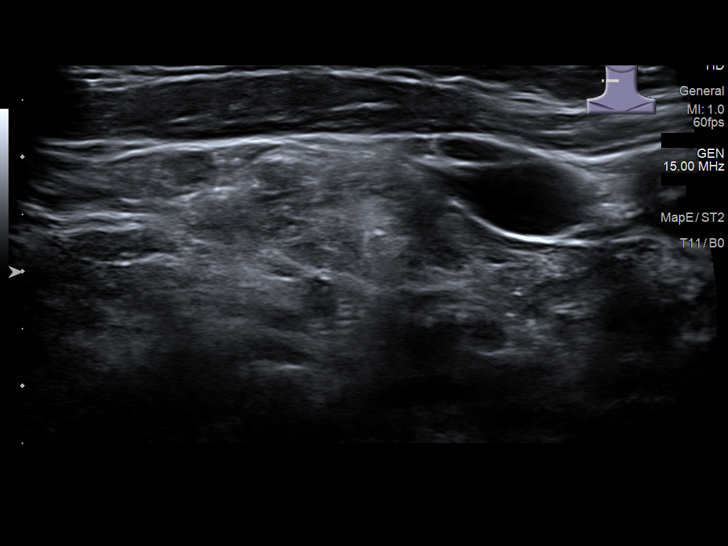
[im 69/69]
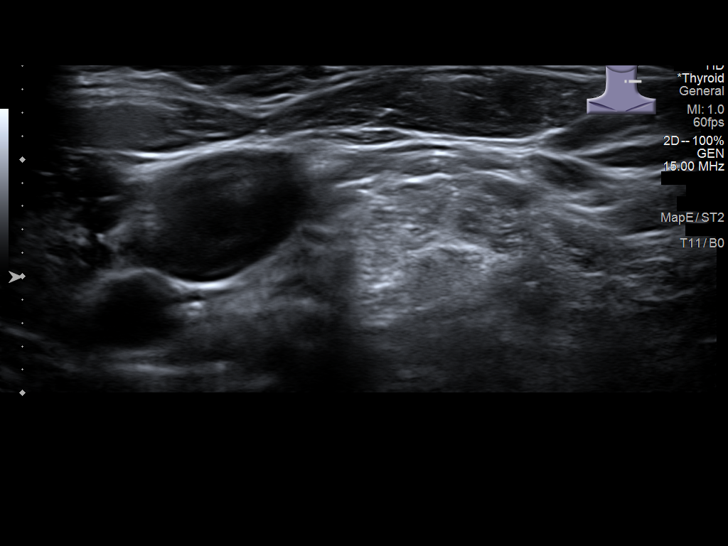

[13 of 25 positions shown; findings below may reference images not displayed]

FINDINGS: Parenchymal Echotexture: Mildly heterogenous

Isthmus: 0.4 cm (previously 0.4 cm)

Right lobe: 4.5 x 1.3 x 1.7 cm (previously 4.3 x 1.3 x 1.6 cm)

Left lobe: 5.5 x 1.6 x 2.0 cm (previously 5.4 x 1.6 x 1.9 cm)

_________________________________________________________

Estimated total number of nodules >/= 1 cm: 2

Number of spongiform nodules >/=  2 cm not described below (TR1): 0

Number of mixed cystic and solid nodules >/= 1.5 cm not described
below (TR2): 0

_________________________________________________________

Nodule # 1:

Location: Left; Mid, lateral

Maximum size: 1.1 cm; Other 2 dimensions: 0.8 x 0.7 cm (previously
1.0 x 0.8 x 0 9 cm)

Composition: solid/almost completely solid (2)

Echogenicity: isoechoic (1)

Shape: taller-than-wide (3)

Margins: ill-defined (0)

Echogenic foci: none (0)

ACR TI-RADS total points: 6.

ACR TI-RADS risk category: TR4 (4-6 points).

ACR TI-RADS recommendations:

*Given size (>/= 1 - 1.4 cm) and appearance, a follow-up ultrasound
in 1 year should be considered based on TI-RADS criteria.

_________________________________________________________

Nodule # 2:

Location: Left; Mid, medial

Maximum size: 1.9 cm; Other 2 dimensions: 1.4 x 1.3 cm (previously
1.6 x 1.3 x 1.6 cm)

Composition: mixed cystic and solid (1)

Echogenicity: isoechoic (1)

Shape: not taller-than-wide (0)

Margins: ill-defined (0)

Echogenic foci: none (0)

ACR TI-RADS total points: 2.

ACR TI-RADS risk category: TR2 (2 points).

ACR TI-RADS recommendations:

This nodule does NOT meet TI-RADS criteria for biopsy or dedicated
follow-up.

_________________________________________________________

No adenopathy within the imaged neck.

Nodule #3: Similar appearance of small (0.5 cm) benign colloid cyst
within the LEFT superior thyroid lobe.
IMPRESSION: 1. Relatively similar size and appearance of LEFT thyroid nodules,
with none meeting criteria for biopsy per current guidelines.
2. Continue annual ultrasound evaluation of LEFT TR-4 nodule, until
stability is confirmed by surveillance for total of 5 years.

The above is in keeping with the ACR TI-RADS recommendations - [HOSPITAL] 7706;[DATE].

## 2023-06-11 ENCOUNTER — Encounter: Payer: Self-pay | Admitting: *Deleted

## 2023-06-25 ENCOUNTER — Telehealth: Payer: Self-pay

## 2023-06-25 ENCOUNTER — Other Ambulatory Visit: Payer: Self-pay | Admitting: *Deleted

## 2023-06-25 ENCOUNTER — Telehealth: Payer: Self-pay | Admitting: *Deleted

## 2023-06-25 DIAGNOSIS — Z860100 Personal history of colon polyps, unspecified: Secondary | ICD-10-CM

## 2023-06-25 MED ORDER — NA SULFATE-K SULFATE-MG SULF 17.5-3.13-1.6 GM/177ML PO SOLN: 1.0000 | Freq: Once | ORAL | 0 refills | Status: AC

## 2023-06-25 NOTE — Telephone Encounter (Addendum)
 Spoken to Dre from [employer]. However, unable to schedule patient's colonoscopy because he is not on DPR.  Patient will have to give a verbal permission or they can come and sign [facility] DPR.  Dre verbalized understanding.

## 2023-06-25 NOTE — Telephone Encounter (Signed)
 Gastroenterology Pre-Procedure Review  Request Date: 07/31/2023 Requesting Physician: Dr. Unk  PATIENT REVIEW QUESTIONS: The patient responded to the following health history questions as indicated:    1. Are you having any GI issues? no 2. Do you have a personal history of Polyps? yes (last colonoscopy on 05/14/2018 with Dr Janalyn) 3. Do you have a family history of Colon Cancer or Polyps? no 4. Diabetes Mellitus? no 5. Joint replacements in the past 12 months?no 6. Major health problems in the past 3 months?no 7. Any artificial heart valves, MVP, or defibrillator?no    MEDICATIONS & ALLERGIES:    Patient reports the following regarding taking any anticoagulation/antiplatelet therapy:   Plavix, Coumadin, Eliquis, Xarelto, Lovenox, Pradaxa, Brilinta, or Effient? no Aspirin? yes (81 mg)  Patient confirms/reports the following medications:  Current Outpatient Medications  Medication Sig Dispense Refill   Na Sulfate-K Sulfate-Mg Sulf 17.5-3.13-1.6 GM/177ML SOLN Take 1 kit by mouth once for 1 dose. 354 mL 0   aspirin 81 MG tablet Take 81 mg by mouth daily.     hydrochlorothiazide  (MICROZIDE ) 12.5 MG capsule Take 1 capsule (12.5 mg total) by mouth daily. (Patient not taking: Reported on 06/25/2023) 90 capsule 1   No current facility-administered medications for this visit.    Patient confirms/reports the following allergies:  No Known Allergies  No orders of the defined types were placed in this encounter.   AUTHORIZATION INFORMATION Primary Insurance: 1D#: Group #:  Secondary Insurance: 1D#: Group #:  SCHEDULE INFORMATION: Date: 07/31/2023 Time: Location:  [facility]

## 2023-06-25 NOTE — Telephone Encounter (Signed)
 Colonoscopy schedule on 07/31/2023 with Dr Allegra Lai

## 2023-06-25 NOTE — Telephone Encounter (Signed)
 The patient called in and left a voicemail requesting for [place] to call her back. She wants to schedule her colonoscopy. I called the patient back to let her know we got her message and no answer I sent message to scheduler and transfer to [place].

## 2023-06-25 NOTE — Telephone Encounter (Signed)
 Dre from [facility] [employer] called in to confirm patient referral and double check if patient was scheduled. If patient can't be reach, please call Dre at [phone] and Ext: [phone].

## 2023-07-20 ENCOUNTER — Telehealth: Payer: Self-pay | Admitting: *Deleted

## 2023-07-20 NOTE — Telephone Encounter (Signed)
Patient left a voicemail stating that she got a copay statement from her insurance for $400. Patient wanted to discuss with insurance first because she thought her insurance will cover the whole procedure. Will cancel for now as requested.  Called endo unit as requested to make the change.

## 2023-07-20 NOTE — Telephone Encounter (Signed)
The patient called in to reschedule her colonoscopy because her copay is high. The patient wanted to know why her colonoscopy copay is high and she is just supposed to have a screening.

## 2023-07-20 NOTE — Telephone Encounter (Signed)
Spoken to patient and confirm with patient to cancel the procedure.  Called endo unit to make the change.

## 2023-07-27 ENCOUNTER — Telehealth: Payer: Self-pay

## 2023-07-27 NOTE — Telephone Encounter (Signed)
Patient left a voicemail on the main line stating she is ready to reschedule her colonoscopy.

## 2023-07-27 NOTE — Telephone Encounter (Signed)
Spoken to patient, she is still concern about the $400 text she received. I gave her [facility] phone to see if she can get answer. However, I have told patient that it would be best to contact her insurance regarding about cost.  Patient verbalized understanding.  Will call back to reschedule when she is ready.

## 2023-07-31 ENCOUNTER — Encounter: Admission: RE | Payer: Self-pay | Source: Home / Self Care

## 2023-07-31 ENCOUNTER — Ambulatory Visit: Admission: RE | Admit: 2023-07-31 | Payer: Medicare HMO | Source: Home / Self Care | Admitting: Gastroenterology

## 2023-07-31 SURGERY — COLONOSCOPY WITH PROPOFOL
Anesthesia: General

## 2023-12-07 ENCOUNTER — Encounter: Payer: Self-pay | Admitting: Internal Medicine

## 2024-03-15 ENCOUNTER — Ambulatory Visit: Admitting: Podiatry

## 2024-03-15 DIAGNOSIS — M79674 Pain in right toe(s): Secondary | ICD-10-CM | POA: Diagnosis not present

## 2024-03-15 DIAGNOSIS — B351 Tinea unguium: Secondary | ICD-10-CM

## 2024-03-15 DIAGNOSIS — M79675 Pain in left toe(s): Secondary | ICD-10-CM | POA: Diagnosis not present

## 2024-03-15 NOTE — Progress Notes (Unsigned)
   Subjective:  Patient ID: Catherine Pace, female    DOB: December 19, 1931,  MRN: [id number]  Chief Complaint  Patient presents with   Nail Problem    Nail trim    68 y.o. female returns for the above complaint.  Patient presents with thickened elongated dystrophic mycotic toenails x 10 mild pain on palpation hurts with ambulation hurts with pressure she would like me to be done she is not able to do it herself.  Objective:  There were no vitals filed for this visit. Podiatric Exam: Vascular: dorsalis pedis and posterior tibial pulses are palpable bilateral. Capillary return is immediate. Temperature gradient is WNL. Skin turgor WNL  Sensorium: Normal Semmes Weinstein monofilament test. Normal tactile sensation bilaterally. Nail Exam: Pt has thick disfigured discolored nails with subungual debris noted bilateral entire nail hallux through fifth toenails.  Pain on palpation to the nails. Ulcer Exam: There is no evidence of ulcer or pre-ulcerative changes or infection. Orthopedic Exam: Muscle tone and strength are WNL. No limitations in general ROM. No crepitus or effusions noted.  Skin: No Porokeratosis. No infection or ulcers    Assessment & Plan:   1. Pain due to onychomycosis of toenails of both feet      Patient was evaluated and treated and all questions answered.  Onychomycosis with pain  -Nails palliatively debrided as below. -Educated on self-care  Procedure: Nail Debridement Rationale: pain  Type of Debridement: manual, sharp debridement. Instrumentation: Nail nipper, rotary burr. Number of Nails: 10  Procedures and Treatment: Consent by patient was obtained for treatment procedures. The patient understood the discussion of treatment and procedures well. All questions were answered thoroughly reviewed. Debridement of mycotic and hypertrophic toenails, 1 through 5 bilateral and clearing of subungual debris. No ulceration, no infection noted.  Return Visit-Office Procedure:  Patient instructed to return to the office for a follow up visit 3 months for continued evaluation and treatment.  Nicholes Rough, DPM    No follow-ups on file.

## 2024-06-27 ENCOUNTER — Ambulatory Visit (INDEPENDENT_AMBULATORY_CARE_PROVIDER_SITE_OTHER): Admitting: Podiatry

## 2024-06-27 DIAGNOSIS — Z91199 Patient's noncompliance with other medical treatment and regimen due to unspecified reason: Secondary | ICD-10-CM

## 2024-06-27 NOTE — Progress Notes (Signed)
 1. No-show for appointment

## 2024-09-01 ENCOUNTER — Ambulatory Visit: Admitting: Podiatry
